# Patient Record
Sex: Female | Born: 1940 | Race: White | Hispanic: No | Marital: Married | Smoking: Never smoker
Health system: Southern US, Community
[De-identification: ages and names within clinical notes are randomized; demographics above are authoritative.]

## PROBLEM LIST (undated history)

## (undated) DIAGNOSIS — K219 Gastro-esophageal reflux disease without esophagitis: Secondary | ICD-10-CM

## (undated) DIAGNOSIS — IMO0001 Reserved for inherently not codable concepts without codable children: Secondary | ICD-10-CM

## (undated) DIAGNOSIS — M1990 Unspecified osteoarthritis, unspecified site: Secondary | ICD-10-CM

## (undated) DIAGNOSIS — I219 Acute myocardial infarction, unspecified: Secondary | ICD-10-CM

## (undated) DIAGNOSIS — Z9889 Other specified postprocedural states: Secondary | ICD-10-CM

## (undated) DIAGNOSIS — I10 Essential (primary) hypertension: Secondary | ICD-10-CM

## (undated) DIAGNOSIS — Z5189 Encounter for other specified aftercare: Secondary | ICD-10-CM

## (undated) DIAGNOSIS — G971 Other reaction to spinal and lumbar puncture: Secondary | ICD-10-CM

## (undated) DIAGNOSIS — J189 Pneumonia, unspecified organism: Secondary | ICD-10-CM

## (undated) DIAGNOSIS — R112 Nausea with vomiting, unspecified: Secondary | ICD-10-CM

## (undated) DIAGNOSIS — F419 Anxiety disorder, unspecified: Secondary | ICD-10-CM

## (undated) DIAGNOSIS — I209 Angina pectoris, unspecified: Secondary | ICD-10-CM

## (undated) DIAGNOSIS — E039 Hypothyroidism, unspecified: Secondary | ICD-10-CM

## (undated) DIAGNOSIS — R0602 Shortness of breath: Secondary | ICD-10-CM

## (undated) HISTORY — PX: JOINT REPLACEMENT: SHX530

## (undated) HISTORY — DX: Hypothyroidism, unspecified: E03.9

## (undated) HISTORY — PX: CARDIAC CATHETERIZATION: SHX172

---

## 1978-08-25 HISTORY — PX: HAND SURGERY: SHX662

## 1995-09-26 HISTORY — PX: TOTAL HIP ARTHROPLASTY: SHX124

## 1996-03-25 HISTORY — PX: TOTAL HIP ARTHROPLASTY: SHX124

## 1996-08-25 HISTORY — PX: TOTAL KNEE ARTHROPLASTY: SHX125

## 2004-12-17 ENCOUNTER — Ambulatory Visit: Payer: Self-pay | Admitting: Pulmonary Disease

## 2004-12-17 ENCOUNTER — Inpatient Hospital Stay (HOSPITAL_COMMUNITY): Admission: AD | Admit: 2004-12-17 | Discharge: 2004-12-29 | Payer: Self-pay | Admitting: Pulmonary Disease

## 2004-12-17 ENCOUNTER — Encounter: Payer: Self-pay | Admitting: *Deleted

## 2004-12-25 ENCOUNTER — Ambulatory Visit: Payer: Self-pay | Admitting: Cardiology

## 2005-01-03 ENCOUNTER — Ambulatory Visit: Payer: Self-pay | Admitting: Cardiology

## 2005-08-07 ENCOUNTER — Ambulatory Visit: Payer: Self-pay | Admitting: Cardiology

## 2005-09-04 ENCOUNTER — Ambulatory Visit: Payer: Self-pay

## 2005-09-17 ENCOUNTER — Ambulatory Visit: Payer: Self-pay | Admitting: Cardiology

## 2011-09-16 ENCOUNTER — Inpatient Hospital Stay (HOSPITAL_COMMUNITY)
Admission: AD | Admit: 2011-09-16 | Discharge: 2011-09-19 | DRG: 467 | Disposition: A | Discharge status: Skilled Nursing Facility | Payer: Medicare Other | Source: Other Acute Inpatient Hospital | Attending: Orthopedic Surgery | Admitting: Orthopedic Surgery

## 2011-09-16 ENCOUNTER — Other Ambulatory Visit: Payer: Self-pay

## 2011-09-16 ENCOUNTER — Encounter (HOSPITAL_COMMUNITY): Payer: Self-pay | Admitting: *Deleted

## 2011-09-16 DIAGNOSIS — K219 Gastro-esophageal reflux disease without esophagitis: Secondary | ICD-10-CM | POA: Diagnosis present

## 2011-09-16 DIAGNOSIS — T84069A Wear of articular bearing surface of unspecified internal prosthetic joint, initial encounter: Secondary | ICD-10-CM | POA: Diagnosis present

## 2011-09-16 DIAGNOSIS — F411 Generalized anxiety disorder: Secondary | ICD-10-CM | POA: Diagnosis present

## 2011-09-16 DIAGNOSIS — W010XXA Fall on same level from slipping, tripping and stumbling without subsequent striking against object, initial encounter: Secondary | ICD-10-CM | POA: Diagnosis present

## 2011-09-16 DIAGNOSIS — Z96659 Presence of unspecified artificial knee joint: Secondary | ICD-10-CM

## 2011-09-16 DIAGNOSIS — Y9229 Other specified public building as the place of occurrence of the external cause: Secondary | ICD-10-CM

## 2011-09-16 DIAGNOSIS — I252 Old myocardial infarction: Secondary | ICD-10-CM

## 2011-09-16 DIAGNOSIS — M9702XA Periprosthetic fracture around internal prosthetic left hip joint, initial encounter: Secondary | ICD-10-CM

## 2011-09-16 DIAGNOSIS — M129 Arthropathy, unspecified: Secondary | ICD-10-CM | POA: Diagnosis present

## 2011-09-16 DIAGNOSIS — I10 Essential (primary) hypertension: Secondary | ICD-10-CM | POA: Diagnosis present

## 2011-09-16 DIAGNOSIS — S72033A Displaced midcervical fracture of unspecified femur, initial encounter for closed fracture: Principal | ICD-10-CM | POA: Diagnosis present

## 2011-09-16 DIAGNOSIS — Z96649 Presence of unspecified artificial hip joint: Secondary | ICD-10-CM

## 2011-09-16 DIAGNOSIS — Y831 Surgical operation with implant of artificial internal device as the cause of abnormal reaction of the patient, or of later complication, without mention of misadventure at the time of the procedure: Secondary | ICD-10-CM | POA: Diagnosis present

## 2011-09-16 HISTORY — DX: Unspecified osteoarthritis, unspecified site: M19.90

## 2011-09-16 HISTORY — DX: Other specified postprocedural states: Z98.890

## 2011-09-16 HISTORY — DX: Other reaction to spinal and lumbar puncture: G97.1

## 2011-09-16 HISTORY — DX: Reserved for inherently not codable concepts without codable children: IMO0001

## 2011-09-16 HISTORY — DX: Encounter for other specified aftercare: Z51.89

## 2011-09-16 HISTORY — DX: Other specified postprocedural states: R11.2

## 2011-09-16 HISTORY — DX: Essential (primary) hypertension: I10

## 2011-09-16 HISTORY — DX: Angina pectoris, unspecified: I20.9

## 2011-09-16 HISTORY — DX: Shortness of breath: R06.02

## 2011-09-16 HISTORY — DX: Gastro-esophageal reflux disease without esophagitis: K21.9

## 2011-09-16 HISTORY — DX: Acute myocardial infarction, unspecified: I21.9

## 2011-09-16 HISTORY — DX: Anxiety disorder, unspecified: F41.9

## 2011-09-16 MED ORDER — PREDNISONE (PAK) 5 MG PO TABS
5.0000 mg | ORAL_TABLET | Freq: Once | ORAL | Status: AC
  Administered 2011-09-16: 5 mg via ORAL
  Filled 2011-09-16: qty 21

## 2011-09-16 MED ORDER — SODIUM CHLORIDE 0.9 % IV SOLN
INTRAVENOUS | Status: DC
  Administered 2011-09-16 – 2011-09-17 (×3): via INTRAVENOUS
  Filled 2011-09-16 (×8): qty 1000

## 2011-09-16 MED ORDER — CHLORHEXIDINE GLUCONATE 0.12 % MT SOLN
15.0000 mL | Freq: Two times a day (BID) | OROMUCOSAL | Status: DC
  Administered 2011-09-16 – 2011-09-19 (×6): 15 mL via OROMUCOSAL
  Filled 2011-09-16 (×10): qty 15

## 2011-09-16 MED ORDER — LISINOPRIL 2.5 MG PO TABS
2.5000 mg | ORAL_TABLET | Freq: Every day | ORAL | Status: DC
  Administered 2011-09-16 – 2011-09-19 (×4): 2.5 mg via ORAL
  Filled 2011-09-16 (×5): qty 1

## 2011-09-16 MED ORDER — BIOTENE DRY MOUTH MT LIQD
15.0000 mL | Freq: Two times a day (BID) | OROMUCOSAL | Status: DC
  Administered 2011-09-16 – 2011-09-19 (×7): 15 mL via OROMUCOSAL

## 2011-09-16 MED ORDER — PANTOPRAZOLE SODIUM 40 MG PO TBEC
40.0000 mg | DELAYED_RELEASE_TABLET | Freq: Every day | ORAL | Status: DC | PRN
  Administered 2011-09-19: 40 mg via ORAL
  Filled 2011-09-16: qty 1

## 2011-09-16 MED ORDER — HYDROMORPHONE HCL PF 1 MG/ML IJ SOLN
0.5000 mg | INTRAMUSCULAR | Status: DC | PRN
  Administered 2011-09-16 (×5): 1 mg via INTRAVENOUS
  Administered 2011-09-17: 0.5 mg via INTRAVENOUS
  Administered 2011-09-17 (×4): 1 mg via INTRAVENOUS
  Filled 2011-09-16 (×10): qty 1

## 2011-09-16 MED ORDER — ACETAMINOPHEN 500 MG PO TABS
1000.0000 mg | ORAL_TABLET | Freq: Three times a day (TID) | ORAL | Status: DC
  Administered 2011-09-16 – 2011-09-17 (×4): 1000 mg via ORAL
  Filled 2011-09-16 (×13): qty 2

## 2011-09-16 NOTE — Anesthesia Preprocedure Evaluation (Addendum)
Anesthesia Evaluation  Patient identified by MRN, date of birth, ID band Patient awake    Reviewed: Allergy & Precautions, H&P , NPO status , Patient's Chart, lab work & pertinent test results  History of Anesthesia Complications (+) PONV and POST - OP SPINAL HEADACHE  Airway Mallampati: IV TM Distance: <3 FB Neck ROM: full  Mouth opening: Limited Mouth Opening Comment: Potential difficult intubation Dental  (+) Loose and Missing,    Pulmonary neg pulmonary ROS, shortness of breath and with exertion,  clear to auscultation  Pulmonary exam normal       Cardiovascular Exercise Tolerance: Good hypertension, On Medications + angina with exertion + CAD and + Past MI regular Normal MI in 2006.  Had cath. Then and has had no problems since.  Cardiology released her from follow up.  No CP.   Neuro/Psych  Headaches, Negative Neurological ROS  Negative Psych ROS   GI/Hepatic negative GI ROS, Neg liver ROS, GERD-  ,  Endo/Other  Negative Endocrine ROS  Renal/GU negative Renal ROS  Genitourinary negative   Musculoskeletal  (+) Rheumatoid disorders,    Abdominal   Peds  Hematology negative hematology ROS (+)   Anesthesia Other Findings   Reproductive/Obstetrics negative OB ROS                         Anesthesia Physical Anesthesia Plan  ASA: III  Anesthesia Plan: General   Post-op Pain Management:    Induction: Intravenous  Airway Management Planned: Oral ETT and Video Laryngoscope Planned  Additional Equipment:   Intra-op Plan:   Post-operative Plan: Extubation in OR  Informed Consent: I have reviewed the patients History and Physical, chart, labs and discussed the procedure including the risks, benefits and alternatives for the proposed anesthesia with the patient or authorized representative who has indicated his/her understanding and acceptance.   Dental Advisory Given  Plan Discussed  with: CRNA and Surgeon  Anesthesia Plan Comments: (She had a PDPH for a month previously but is willing to have SAB if anesthesiologist prefers this.  She is also OK with GA but you will need glide scope.  I got Dr. Charlann Boxer to order labs and ECG.)      Anesthesia Quick Evaluation

## 2011-09-16 NOTE — H&P (Signed)
Alyssa Coleman is an 71 y.o. female.    Chief Complaint: left hip pain after a fall  HPI: Pt is a 71 y.o. female complaining of left hip pain since last night. She was at [employer] last night, as she was leaving she possibly tripped over a mat and fell.  She fell directly on her left hip and had instant pain. She was brought to the ER in [place], [place] where x-rays revealed a left hip peri-prosthetic fracture. Pt then asked to be transferred to [facility] since her doctors are from the area. After having a discussion with the pt she understands that she will need surgery to fix the fracture. Risks, benefits and expectations were discussed with the patient. Patient understand the risks, benefits and expectations and wishes to proceed with an ORIF of the left peri-prosthetic fracture.  PCP:  No primary provider on file.  D/C Plans:  SNF/Rehab  Post-op Meds:  No Rx given   PMH: Past Medical History  Diagnosis Date  . PONV (postoperative nausea and vomiting)   . Spinal headache   . Shortness of breath   . Angina   . Hypertension   . Blood transfusion   . GERD (gastroesophageal reflux disease)   . Arthritis   . Anxiety   . Myocardial infarction     PSH: Past Surgical History  Procedure Date  . 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 Total hip arthroplasty aug 97    right hip  . Total knee arthroplasty 1998    left  . Hand surgery 1980    skin infection  . Cardiac catheterization   . Joint replacement     Social History:  reports that she has never smoked. She does not have any smokeless tobacco history on file. She reports that she does not drink alcohol or use illicit drugs.  Allergies:  Allergies  Allergen Reactions  . Adult Aspirin Ec (Aspirin) Nausea Only  . Codeine Nausea Only  . Hydrocodone Hives    Medications: Current Facility-Administered Medications  Medication Dose Route Frequency Provider Last Rate Last Dose  . antiseptic  oral rinse (BIOTENE) solution 15 mL  15 mL Mouth Rinse q12n4p Alyssa Pal, MD      . chlorhexidine (PERIDEX) 0.12 % solution 15 mL  15 mL Mouth Rinse BID Alyssa Pal, MD   15 mL at 09/16/11 0811  . HYDROmorphone (DILAUDID) injection 0.5-1 mg  0.5-1 mg Intravenous Q2H PRN Alyssa [facility] [place], PA   1 mg at 09/16/11 1610  . sodium chloride 0.9 % 1,000 mL with potassium chloride 10 mEq infusion   Intravenous Continuous Alyssa Coleman Alyssa [place], PA 75 mL/hr at 09/16/11 0510      ROS: Review of Systems  Constitutional: Negative.   HENT: Negative.   Eyes: Negative.   Respiratory: Negative.   Cardiovascular: Negative.   Gastrointestinal: Negative.   Genitourinary: Negative.   Musculoskeletal: Positive for joint pain (primarily acute pain of the left hip. Generalized joint pain from RA) and falls (tripped and fell for most acute problem).  Skin: Negative.   Neurological: Negative.   Endo/Heme/Allergies: Negative.   Psychiatric/Behavioral: Negative.      Physican Exam: Blood pressure 145/70, pulse 80, temperature 99.2 F (37.3 C), temperature source Oral, resp. rate 20, height 5\' 7"  (1.702 m), weight 140 lb 1.6 oz (63.549 kg). Physical Exam  Constitutional: She is well-developed, well-nourished, and in no distress.  HENT:  Head: Normocephalic and atraumatic.  Nose: Nose normal.  Mouth/Throat: Oropharynx is clear and moist.  Eyes: Pupils are equal, round, and reactive to light.  Neck: Neck supple. No JVD present. No tracheal deviation present. No thyromegaly present.  Cardiovascular: Normal rate and regular rhythm.   Pulmonary/Chest: Effort normal and breath sounds normal.  Abdominal: Soft. There is no tenderness. There is no guarding.  Musculoskeletal:       Left hip: She exhibits decreased range of motion (pain with any ROM), decreased strength (generalized, unable to fully test do to pain with any ROM), tenderness and bony tenderness. She exhibits no swelling, no crepitus, no  deformity and no laceration.  Skin: Skin is warm and dry.  Psychiatric: Affect normal.     Assessment/Plan Assessment: Left hip peri-prosthetic fracture  Plan: Patient will undergo an ORIF of the left hip peri-prosthetic fracture. Risks benefits and expectation were discussed with the patient. Patient understand risks, benefits and expectation and wishes to proceed.   Alyssa [facility]   PAC  09/16/2011, 11:34 AM

## 2011-09-17 ENCOUNTER — Encounter (HOSPITAL_COMMUNITY): Payer: Self-pay | Admitting: Anesthesiology

## 2011-09-17 ENCOUNTER — Encounter (HOSPITAL_COMMUNITY)
Admission: AD | Disposition: A | Discharge status: Skilled Nursing Facility | Payer: Self-pay | Source: Other Acute Inpatient Hospital | Attending: Orthopedic Surgery

## 2011-09-17 ENCOUNTER — Inpatient Hospital Stay (HOSPITAL_COMMUNITY): Payer: Medicare Other

## 2011-09-17 ENCOUNTER — Inpatient Hospital Stay (HOSPITAL_COMMUNITY): Payer: Medicare Other | Admitting: Anesthesiology

## 2011-09-17 DIAGNOSIS — M9702XA Periprosthetic fracture around internal prosthetic left hip joint, initial encounter: Secondary | ICD-10-CM

## 2011-09-17 HISTORY — PX: ORIF PERIPROSTHETIC FRACTURE: SHX5034

## 2011-09-17 LAB — BASIC METABOLIC PANEL
BUN: 9 mg/dL (ref 6–23)
CO2: 24 mEq/L (ref 19–32)
Chloride: 104 mEq/L (ref 96–112)
Creatinine, Ser: 0.49 mg/dL — ABNORMAL LOW (ref 0.50–1.10)
GFR calc Af Amer: >90 mL/min (ref >90)
Potassium: 3.7 mEq/L (ref 3.5–5.1)

## 2011-09-17 LAB — CBC
MCH: 32.8 pg (ref 26.0–34.0)
Platelets: 225 10*3/uL (ref 150–400)

## 2011-09-17 LAB — TYPE AND SCREEN
ABO/RH(D): O NEG
Antibody Screen: NEGATIVE

## 2011-09-17 LAB — SURGICAL PCR SCREEN: MRSA, PCR: NEGATIVE

## 2011-09-17 SURGERY — OPEN REDUCTION INTERNAL FIXATION (ORIF) PERIPROSTHETIC FRACTURE
Anesthesia: General | Site: Hip | Laterality: Left | Wound class: Clean

## 2011-09-17 MED ORDER — GLYCOPYRROLATE 0.2 MG/ML IJ SOLN
INTRAMUSCULAR | Status: DC | PRN
  Administered 2011-09-17: .2 mg via INTRAVENOUS
  Administered 2011-09-17: 0.2 mg via INTRAVENOUS

## 2011-09-17 MED ORDER — CEFAZOLIN SODIUM 1-5 GM-% IV SOLN
INTRAVENOUS | Status: DC | PRN
  Administered 2011-09-17: 1 g via INTRAVENOUS

## 2011-09-17 MED ORDER — SUCCINYLCHOLINE CHLORIDE 20 MG/ML IJ SOLN
INTRAMUSCULAR | Status: DC | PRN
  Administered 2011-09-17: 80 mg via INTRAVENOUS

## 2011-09-17 MED ORDER — ROCURONIUM BROMIDE 100 MG/10ML IV SOLN
INTRAVENOUS | Status: DC | PRN
  Administered 2011-09-17: 20 mg via INTRAVENOUS

## 2011-09-17 MED ORDER — PROPOFOL 10 MG/ML IV EMUL
INTRAVENOUS | Status: DC | PRN
  Administered 2011-09-17: 100 mg via INTRAVENOUS

## 2011-09-17 MED ORDER — METHOCARBAMOL 100 MG/ML IJ SOLN
500.0000 mg | Freq: Four times a day (QID) | INTRAVENOUS | Status: DC | PRN
  Administered 2011-09-17: 500 mg via INTRAVENOUS
  Filled 2011-09-17 (×2): qty 5

## 2011-09-17 MED ORDER — PHENYLEPHRINE HCL 10 MG/ML IJ SOLN
10.0000 mg | INTRAVENOUS | Status: DC | PRN
  Administered 2011-09-17: 10 ug/min via INTRAVENOUS

## 2011-09-17 MED ORDER — ACETAMINOPHEN 10 MG/ML IV SOLN
INTRAVENOUS | Status: DC | PRN
  Administered 2011-09-17: 1000 mg via INTRAVENOUS

## 2011-09-17 MED ORDER — STERILE WATER FOR IRRIGATION IR SOLN
Status: DC | PRN
  Administered 2011-09-17: 1500 mL

## 2011-09-17 MED ORDER — DEXAMETHASONE SODIUM PHOSPHATE 10 MG/ML IJ SOLN
INTRAMUSCULAR | Status: DC | PRN
  Administered 2011-09-17: 10 mg via INTRAVENOUS

## 2011-09-17 MED ORDER — PROMETHAZINE HCL 25 MG/ML IJ SOLN: 6.2500 mg | INTRAMUSCULAR | Status: DC | PRN

## 2011-09-17 MED ORDER — LIDOCAINE HCL (CARDIAC) 20 MG/ML IV SOLN
INTRAVENOUS | Status: DC | PRN
  Administered 2011-09-17: 50 mg via INTRAVENOUS

## 2011-09-17 MED ORDER — NEOSTIGMINE METHYLSULFATE 1 MG/ML IJ SOLN
INTRAMUSCULAR | Status: DC | PRN
  Administered 2011-09-17: 1.5 mg via INTRAVENOUS

## 2011-09-17 MED ORDER — FENTANYL CITRATE 0.05 MG/ML IJ SOLN
INTRAMUSCULAR | Status: DC | PRN
  Administered 2011-09-17 (×2): 75 ug via INTRAVENOUS
  Administered 2011-09-17 (×2): 50 ug via INTRAVENOUS
  Administered 2011-09-17: 75 ug via INTRAVENOUS

## 2011-09-17 MED ORDER — HETASTARCH-ELECTROLYTES 6 % IV SOLN
INTRAVENOUS | Status: DC | PRN
  Administered 2011-09-17: 20:00:00 via INTRAVENOUS

## 2011-09-17 MED ORDER — FENTANYL CITRATE 0.05 MG/ML IJ SOLN
25.0000 ug | INTRAMUSCULAR | Status: DC | PRN
  Administered 2011-09-17 (×2): 25 ug via INTRAVENOUS

## 2011-09-17 MED ORDER — METHOCARBAMOL 500 MG PO TABS
500.0000 mg | ORAL_TABLET | Freq: Four times a day (QID) | ORAL | Status: DC | PRN
  Administered 2011-09-19: 500 mg via ORAL
  Filled 2011-09-17: qty 1

## 2011-09-17 MED ORDER — 0.9 % SODIUM CHLORIDE (POUR BTL) OPTIME
TOPICAL | Status: DC | PRN
  Administered 2011-09-17 (×2): 1000 mL

## 2011-09-17 MED ORDER — LACTATED RINGERS IV SOLN
INTRAVENOUS | Status: DC | PRN
  Administered 2011-09-17 (×2): via INTRAVENOUS

## 2011-09-17 MED ORDER — PHENYLEPHRINE HCL 10 MG/ML IJ SOLN
INTRAMUSCULAR | Status: DC | PRN
  Administered 2011-09-17: 60 ug via INTRAVENOUS
  Administered 2011-09-17 (×2): 40 ug via INTRAVENOUS

## 2011-09-17 MED ORDER — ONDANSETRON HCL 4 MG/2ML IJ SOLN
INTRAMUSCULAR | Status: DC | PRN
  Administered 2011-09-17: 4 mg via INTRAVENOUS

## 2011-09-17 SURGICAL SUPPLY — 71 items
ADH SKN CLS APL DERMABOND .7 (GAUZE/BANDAGES/DRESSINGS) ×1
BAG SPEC THK2 15X12 ZIP CLS (MISCELLANEOUS) ×2
BAG ZIPLOCK 12X15 (MISCELLANEOUS) ×3 IMPLANT
BLADE SAW SGTL 18X1.27X75 (BLADE) ×1 IMPLANT
CABLE (Orthopedic Implant) ×3 IMPLANT
CLOTH BEACON ORANGE TIMEOUT ST (SAFETY) ×2 IMPLANT
CUBES CANC 30CC PCANCUBE30 (Bone Implant) ×2 IMPLANT
DERMABOND ADVANCED (GAUZE/BANDAGES/DRESSINGS) ×1
DERMABOND ADVANCED .7 DNX12 (GAUZE/BANDAGES/DRESSINGS) IMPLANT
DRAPE INCISE IOBAN 66X45 STRL (DRAPES) ×2 IMPLANT
DRAPE INCISE IOBAN 85X60 (DRAPES) ×1 IMPLANT
DRAPE ORTHO SPLIT 77X108 STRL (DRAPES) ×4
DRAPE POUCH INSTRU U-SHP 10X18 (DRAPES) ×2 IMPLANT
DRAPE SURG 17X11 SM STRL (DRAPES) ×2 IMPLANT
DRAPE SURG ORHT 6 SPLT 77X108 (DRAPES) ×2 IMPLANT
DRAPE U-SHAPE 47X51 STRL (DRAPES) ×2 IMPLANT
DRSG EMULSION OIL 3X16 NADH (GAUZE/BANDAGES/DRESSINGS) ×1 IMPLANT
DRSG MEPILEX BORDER 4X12 (GAUZE/BANDAGES/DRESSINGS) ×2 IMPLANT
DRSG MEPILEX BORDER 4X8 (GAUZE/BANDAGES/DRESSINGS) ×2 IMPLANT
DRSG PAD ABDOMINAL 8X10 ST (GAUZE/BANDAGES/DRESSINGS) ×2 IMPLANT
DRSG TEGADERM 4X4.75 (GAUZE/BANDAGES/DRESSINGS) ×1 IMPLANT
DURAPREP 26ML APPLICATOR (WOUND CARE) ×2 IMPLANT
ELECT BLADE TIP CTD 4 INCH (ELECTRODE) ×2 IMPLANT
ELECT REM PT RETURN 9FT ADLT (ELECTROSURGICAL) ×2
ELECTRODE REM PT RTRN 9FT ADLT (ELECTROSURGICAL) ×1 IMPLANT
EVACUATOR 1/8 PVC DRAIN (DRAIN) ×2 IMPLANT
FACESHIELD LNG OPTICON STERILE (SAFETY) ×8 IMPLANT
GAUZE SPONGE 2X2 8PLY STRL LF (GAUZE/BANDAGES/DRESSINGS) IMPLANT
GLOVE BIOGEL PI IND STRL 7.5 (GLOVE) ×1 IMPLANT
GLOVE BIOGEL PI IND STRL 8 (GLOVE) ×1 IMPLANT
GLOVE BIOGEL PI INDICATOR 7.5 (GLOVE) ×1
GLOVE BIOGEL PI INDICATOR 8 (GLOVE) ×2
GLOVE ECLIPSE 8.0 STRL XLNG CF (GLOVE) IMPLANT
GLOVE ORTHO TXT STRL SZ7.5 (GLOVE) ×3 IMPLANT
GLOVE SURG ORTHO 8.0 STRL STRW (GLOVE) ×3 IMPLANT
GLOVE SURG SS PI 6.5 STRL IVOR (GLOVE) ×2 IMPLANT
GLOVE SURG SS PI 8.0 STRL IVOR (GLOVE) ×1 IMPLANT
GLOVE SURG SS PI 8.5 STRL IVOR (GLOVE) ×2
GLOVE SURG SS PI 8.5 STRL STRW (GLOVE) IMPLANT
GOWN BRE IMP PREV XXLGXLNG (GOWN DISPOSABLE) ×3 IMPLANT
GOWN STRL NON-REIN LRG LVL3 (GOWN DISPOSABLE) ×2 IMPLANT
GRAFT BNE CANC CUBE 1X1X1 30CC (Bone Implant) IMPLANT
HEAD OSTEO CTAPER L FIT (Orthopedic Implant) ×2 IMPLANT
HEAD OSTEO CTAPER L FIT 32 +5 (Orthopedic Implant) IMPLANT
IMMOBILIZER KNEE 20 (SOFTGOODS) ×2
IMMOBILIZER KNEE 20 THIGH 36 (SOFTGOODS) IMPLANT
INSERT ACTB 10D 54X32XSTRL LF (Orthopedic Implant) IMPLANT
INSERT OSTEO CFIRE ACET (Orthopedic Implant) ×2 IMPLANT
INSRT ACTB 10D 54X32XSTRL LF (Orthopedic Implant) ×1 IMPLANT
KIT BASIN OR (CUSTOM PROCEDURE TRAY) ×2 IMPLANT
MANIFOLD NEPTUNE II (INSTRUMENTS) ×2 IMPLANT
NS IRRIG 1000ML POUR BTL (IV SOLUTION) ×4 IMPLANT
PACK TOTAL JOINT (CUSTOM PROCEDURE TRAY) ×2 IMPLANT
PASSER SUT SWANSON 36MM LOOP (INSTRUMENTS) IMPLANT
POSITIONER SURGICAL ARM (MISCELLANEOUS) ×2 IMPLANT
SPONGE GAUZE 2X2 STER 10/PKG (GAUZE/BANDAGES/DRESSINGS) ×1
SPONGE GAUZE 4X4 12PLY (GAUZE/BANDAGES/DRESSINGS) ×2 IMPLANT
SPONGE LAP 18X18 X RAY DECT (DISPOSABLE) ×2 IMPLANT
SPONGE LAP 4X18 X RAY DECT (DISPOSABLE) ×1 IMPLANT
STAPLER VISISTAT 35W (STAPLE) ×3 IMPLANT
STRIP CLOSURE SKIN 1/2X4 (GAUZE/BANDAGES/DRESSINGS) IMPLANT
SUCTION FRAZIER TIP 10 FR DISP (SUCTIONS) ×2 IMPLANT
SUT ETHIBOND NAB CT1 #1 30IN (SUTURE) IMPLANT
SUT MNCRL AB 4-0 PS2 18 (SUTURE) IMPLANT
SUT VIC AB 1 CT1 27 (SUTURE) ×8
SUT VIC AB 1 CT1 27XBRD ANTBC (SUTURE) ×2 IMPLANT
SUT VIC AB 2-0 CT1 27 (SUTURE) ×8
SUT VIC AB 2-0 CT1 TAPERPNT 27 (SUTURE) ×2 IMPLANT
TOWEL OR 17X26 10 PK STRL BLUE (TOWEL DISPOSABLE) ×4 IMPLANT
TRAY FOLEY CATH 14FRSI W/METER (CATHETERS) ×1 IMPLANT
WATER STERILE IRR 1500ML POUR (IV SOLUTION) ×2 IMPLANT

## 2011-09-17 NOTE — Transfer of Care (Signed)
Immediate Anesthesia Transfer of Care Note  Patient: Alyssa [facility]  Procedure(s) Performed:  OPEN REDUCTION INTERNAL FIXATION (ORIF) PERIPROSTHETIC FRACTURE  Patient Location: PACU  Anesthesia Type: General  Level of Consciousness: awake, alert , patient cooperative and responds to stimulation  Airway & Oxygen Therapy: Patient Spontanous Breathing and Patient connected to face mask oxygen  Post-op Assessment: Report given to PACU RN, Post -op Vital signs reviewed and stable and Patient moving all extremities  Post vital signs: Reviewed and stable  Complications: No apparent anesthesia complications

## 2011-09-17 NOTE — Brief Op Note (Signed)
09/16/2011 - 09/17/2011  8:21 PM  PATIENT:  Alyssa [facility]  71 y.o. female  PRE-OPERATIVE DIAGNOSIS:  Periprosthetic Fracture of the Left Hip status post LEFT THR 1997  POST-OPERATIVE DIAGNOSIS: Periprosthetic Fracture of the Left Hip status post LEFT THR 1997, stable femoral component  PROCEDURE:  Procedure(s): OPEN REDUCTION INTERNAL FIXATION (ORIF) LEFT PERIPROSTHETIC femur fracture around and stable femoral stem, 2. Revision of the left hip replacement With new updated acetabular liner and new femoral head, [employer] 54/56-32-10 degree lipped liner, 32+5 head   SURGEON:  Surgeon(s): Shelda Pal, MD  PHYSICIAN ASSISTANT: Lanney Gins, PA-C  ANESTHESIA:   general  EBL:  Total I/O In: 1500 [I.V.:1000; IV Piggyback:500] Out: 375 [Urine:75; Blood:300]  BLOOD ADMINISTERED:none  DRAINS: (1 medium) Hemovact drain(s) in the left hip with  Suction Open   LOCAL MEDICATIONS USED:  NONE  SPECIMEN:  No Specimen  DISPOSITION OF SPECIMEN:  N/A  COUNTS:  YES  TOURNIQUET:  * No tourniquets in log *  DICTATION: .Other Dictation: Dictation Number [id number]  PLAN OF CARE: Admit to inpatient   PATIENT DISPOSITION:  PACU - hemodynamically stable.   Delay start of Pharmacological VTE agent (>24hrs) due to surgical blood loss or risk of bleeding:  {YES/NO/NOT APPLICABLE:20182

## 2011-09-17 NOTE — Progress Notes (Signed)
Subjective:   Procedure(s) (LRB): OPEN REDUCTION INTERNAL FIXATION (ORIF) PERIPROSTHETIC FRACTURE (Right)   Patient reports pain as moderate. No events. Ready to have her hip fixed. No change in condition.  Objective:   VITALS:   Filed Vitals:   09/17/11 1037  BP: 134/72  Pulse: 74  Temp: 98.7 F (37.1 C)  Resp: 17    Neurovascular intact Dorsiflexion/Plantar flexion intact No cellulitis present Compartment soft  LABS  Basename 09/17/11 0415  HGB 10.5*  HCT 31.6*  WBC 13.5*  PLT 225     Basename 09/17/11 0415  NA 135  K 3.7  BUN 9  CREATININE 0.49*  GLUCOSE 109*     Assessment/Plan:   Procedure(s) (LRB): OPEN REDUCTION INTERNAL FIXATION (ORIF) PERIPROSTHETIC FRACTURE (Right)   NPO until after surgery later today Continue to control pain with IV medication No change in condition from yesterday Ready for surgical fixation of her left hip periprosthetic fracture.   Anastasio Auerbach Sharry Beining   PAC  09/17/2011, 1:22 PM

## 2011-09-17 NOTE — Anesthesia Postprocedure Evaluation (Signed)
  Anesthesia Post-op Note  Patient: Alyssa [facility]  Procedure(s) Performed:  OPEN REDUCTION INTERNAL FIXATION (ORIF) PERIPROSTHETIC FRACTURE  Patient Location: PACU  Anesthesia Type: General  Level of Consciousness: awake and alert   Airway and Oxygen Therapy: Patient Spontanous Breathing  Post-op Pain: mild  Post-op Assessment: Post-op Vital signs reviewed, Patient's Cardiovascular Status Stable, Respiratory Function Stable, Patent Airway and No signs of Nausea or vomiting  Post-op Vital Signs: stable  Complications: No apparent anesthesia complications

## 2011-09-17 NOTE — Preoperative (Signed)
Beta Blockers   Reason not to administer Beta Blockers:Not Applicable 

## 2011-09-17 NOTE — Progress Notes (Signed)
CSW met with pt /family to assist with d/c planning. Pt hopes to return home following surgery. The possibility of ST SNF discussed with pt. CSW will assist  with SNF placement if recommended by PT and pt is agreeable with plan. Will continue to follow pt to assist with d/c planning needs.

## 2011-09-17 NOTE — Interval H&P Note (Signed)
History and Physical Interval Note:  09/17/2011 6:22 PM  [name]  has presented today for surgery, with the diagnosis of Periprosthetic Fracture of the Left Hip  The various methods of treatment have been discussed with the patient and family. After consideration of risks, benefits and other options for treatment, the patient has consented to  Procedure(s): OPEN REDUCTION INTERNAL FIXATION (ORIF) LEFT HIP PERIPROSTHETIC FRACTURE WITH REVISION OF ARTICULAR SURFACE VERSUS REVISION LEFT HIP SURGERY as a surgical intervention .  The patients' history has been reviewed, patient examined, no change in status, stable for surgery.  I have reviewed the patients' chart and labs.  Questions were answered to the patient's satisfaction.     Alyssa Coleman

## 2011-09-17 NOTE — Interval H&P Note (Deleted)
History and Physical Interval Note:  09/17/2011 6:10 PM  Alyssa Coleman  has presented today for surgery, with the diagnosis of Periprosthetic Fracture of the Right Hip  The various methods of treatment have been discussed with the patient and family. After consideration of risks, benefits and other options for treatment, the patient has consented to  Procedure(s): OPEN REDUCTION INTERNAL FIXATION (ORIF) RIGHT HIP PERIPROSTHETIC FRACTURE WITH REVISION OF THE ARTICULAR  SURFACE WITH BONE GRAFTING as a surgical intervention .  The patients' history has been reviewed, patient examined, no change in status, stable for surgery.  I have reviewed the patients' chart and labs.  Questions were answered to the patient's satisfaction.     Shelda Pal

## 2011-09-17 NOTE — H&P (View-Only) (Signed)
Subjective:   Procedure(s) (LRB): OPEN REDUCTION INTERNAL FIXATION (ORIF) PERIPROSTHETIC FRACTURE (Right)   Patient reports pain as moderate. No events. Ready to have her hip fixed. No change in condition.  Objective:   VITALS:   Filed Vitals:   09/17/11 1037  BP: 134/72  Pulse: 74  Temp: 98.7 F (37.1 C)  Resp: 17    Neurovascular intact Dorsiflexion/Plantar flexion intact No cellulitis present Compartment soft  LABS  Basename 09/17/11 0415  HGB 10.5*  HCT 31.6*  WBC 13.5*  PLT 225     Basename 09/17/11 0415  NA 135  K 3.7  BUN 9  CREATININE 0.49*  GLUCOSE 109*     Assessment/Plan:   Procedure(s) (LRB): OPEN REDUCTION INTERNAL FIXATION (ORIF) PERIPROSTHETIC FRACTURE (Right)   NPO until after surgery later today Continue to control pain with IV medication No change in condition from yesterday Ready for surgical fixation of her left hip periprosthetic fracture.   Kha Hari S. Jermine Bibbee   PAC  09/17/2011, 1:22 PM   

## 2011-09-18 LAB — BASIC METABOLIC PANEL
BUN: 9 mg/dL (ref 6–23)
Chloride: 105 mEq/L (ref 96–112)
GFR calc Af Amer: >90 mL/min (ref >90)
Glucose, Bld: 143 mg/dL — ABNORMAL HIGH (ref 70–99)
Potassium: 3.9 mEq/L (ref 3.5–5.1)
Sodium: 135 mEq/L (ref 135–145)

## 2011-09-18 LAB — CBC
HCT: 24.3 % — ABNORMAL LOW (ref 36.0–46.0)
Hemoglobin: 8.4 g/dL — ABNORMAL LOW (ref 12.0–15.0)
WBC: 11.9 10*3/uL — ABNORMAL HIGH (ref 4.0–10.5)

## 2011-09-18 MED ORDER — ONDANSETRON HCL 4 MG/2ML IJ SOLN: 4.0000 mg | Freq: Four times a day (QID) | INTRAMUSCULAR | Status: DC | PRN

## 2011-09-18 MED ORDER — ALUM & MAG HYDROXIDE-SIMETH 200-200-20 MG/5ML PO SUSP: 30.0000 mL | ORAL | Status: DC | PRN

## 2011-09-18 MED ORDER — METOCLOPRAMIDE HCL 10 MG PO TABS: 5.0000 mg | ORAL_TABLET | Freq: Three times a day (TID) | ORAL | Status: DC | PRN

## 2011-09-18 MED ORDER — FLEET ENEMA 7-19 GM/118ML RE ENEM: 1.0000 | ENEMA | Freq: Once | RECTAL | Status: AC | PRN

## 2011-09-18 MED ORDER — ZOLPIDEM TARTRATE 5 MG PO TABS: 5.0000 mg | ORAL_TABLET | Freq: Every evening | ORAL | Status: DC | PRN

## 2011-09-18 MED ORDER — MENTHOL 3 MG MT LOZG: 1.0000 | LOZENGE | OROMUCOSAL | Status: DC | PRN

## 2011-09-18 MED ORDER — DOCUSATE SODIUM 100 MG PO CAPS
100.0000 mg | ORAL_CAPSULE | Freq: Two times a day (BID) | ORAL | Status: DC
  Administered 2011-09-18 – 2011-09-19 (×4): 100 mg via ORAL
  Filled 2011-09-18 (×6): qty 1

## 2011-09-18 MED ORDER — METOCLOPRAMIDE HCL 5 MG/ML IJ SOLN: 5.0000 mg | Freq: Three times a day (TID) | INTRAMUSCULAR | Status: DC | PRN

## 2011-09-18 MED ORDER — BISACODYL 5 MG PO TBEC: 5.0000 mg | DELAYED_RELEASE_TABLET | Freq: Every day | ORAL | Status: DC | PRN

## 2011-09-18 MED ORDER — ACETAMINOPHEN 325 MG PO TABS: 650.0000 mg | ORAL_TABLET | Freq: Four times a day (QID) | ORAL | Status: DC | PRN

## 2011-09-18 MED ORDER — DIPHENHYDRAMINE HCL 25 MG PO CAPS: 25.0000 mg | ORAL_CAPSULE | Freq: Four times a day (QID) | ORAL | Status: DC | PRN

## 2011-09-18 MED ORDER — POLYETHYLENE GLYCOL 3350 17 G PO PACK
17.0000 g | PACK | Freq: Two times a day (BID) | ORAL | Status: DC
  Administered 2011-09-18 – 2011-09-19 (×4): 17 g via ORAL
  Filled 2011-09-18 (×6): qty 1

## 2011-09-18 MED ORDER — HYDROMORPHONE HCL PF 1 MG/ML IJ SOLN
0.5000 mg | INTRAMUSCULAR | Status: DC | PRN
  Administered 2011-09-18 (×3): 1 mg via INTRAVENOUS
  Filled 2011-09-18 (×3): qty 1

## 2011-09-18 MED ORDER — ONDANSETRON HCL 4 MG PO TABS: 4.0000 mg | ORAL_TABLET | Freq: Four times a day (QID) | ORAL | Status: DC | PRN

## 2011-09-18 MED ORDER — ENOXAPARIN SODIUM 40 MG/0.4ML ~~LOC~~ SOLN
40.0000 mg | SUBCUTANEOUS | Status: DC
  Administered 2011-09-18 – 2011-09-19 (×2): 40 mg via SUBCUTANEOUS
  Filled 2011-09-18 (×2): qty 0.4

## 2011-09-18 MED ORDER — ACETAMINOPHEN 650 MG RE SUPP: 650.0000 mg | Freq: Four times a day (QID) | RECTAL | Status: DC | PRN

## 2011-09-18 MED ORDER — TRAMADOL HCL 50 MG PO TABS
50.0000 mg | ORAL_TABLET | Freq: Four times a day (QID) | ORAL | Status: DC
  Administered 2011-09-18 (×2): 100 mg via ORAL
  Administered 2011-09-18: 50 mg via ORAL
  Administered 2011-09-18: 100 mg via ORAL
  Administered 2011-09-19 (×3): 50 mg via ORAL
  Filled 2011-09-18: qty 1
  Filled 2011-09-18: qty 2
  Filled 2011-09-18 (×2): qty 1
  Filled 2011-09-18 (×6): qty 2

## 2011-09-18 MED ORDER — CEFAZOLIN SODIUM 1-5 GM-% IV SOLN
1.0000 g | Freq: Four times a day (QID) | INTRAVENOUS | Status: AC
  Administered 2011-09-18 (×3): 1 g via INTRAVENOUS
  Filled 2011-09-18 (×4): qty 50

## 2011-09-18 MED ORDER — FERROUS SULFATE 325 (65 FE) MG PO TABS
325.0000 mg | ORAL_TABLET | Freq: Three times a day (TID) | ORAL | Status: DC
  Administered 2011-09-18 – 2011-09-19 (×4): 325 mg via ORAL
  Filled 2011-09-18 (×6): qty 1

## 2011-09-18 MED ORDER — PHENOL 1.4 % MT LIQD: 1.0000 | OROMUCOSAL | Status: DC | PRN

## 2011-09-18 MED ORDER — SODIUM CHLORIDE 0.9 % IV SOLN
100.0000 mL/h | INTRAVENOUS | Status: DC
  Administered 2011-09-18 (×2): 100 mL/h via INTRAVENOUS
  Filled 2011-09-18 (×9): qty 1000

## 2011-09-18 NOTE — Evaluation (Signed)
Physical Therapy Evaluation Patient Details Name: Alyssa Coleman MRN: [id number] DOB: Aug 31, 1940 Today's Date: 09/18/2011 1000-1032 Ev 2 Problem List:  Patient Active Problem List  Diagnoses  . Periprosthetic fracture around internal prosthetic left hip joint    Past Medical History:  Past Medical History  Diagnosis Date  . PONV (postoperative nausea and vomiting)   . Spinal headache   . Shortness of breath   . Angina   . Hypertension   . Blood transfusion   . GERD (gastroesophageal reflux disease)   . Arthritis   . Anxiety   . Myocardial infarction    Past Surgical History:  Past Surgical History  Procedure Date  . 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 Total hip arthroplasty aug 97    right hip  . Total knee arthroplasty 1998    left  . Hand surgery 1980    skin infection  . Cardiac catheterization   . Joint replacement     PT Assessment/Plan/Recommendation PT Assessment Clinical Impression Statement: pt will benefit from PT to maximize independence; Pt srtongly desires to D/C home although she would benefit from [facility];  PT and MD discussed wtih pt; PT Recommendation/Assessment: Patient will need skilled PT in the acute care venue PT Problem List: Decreased strength;Decreased range of motion;Decreased activity tolerance;Decreased balance;Decreased mobility;Pain;Decreased knowledge of precautions;Decreased safety awareness;Decreased knowledge of use of DME PT Plan PT Frequency: Min 6X/week PT Treatment/Interventions: DME instruction;Gait training;Stair training;Functional mobility training;Therapeutic activities;Therapeutic exercise;Balance training;Patient/family education PT Recommendation Follow Up Recommendations: Skilled nursing facility Equipment Recommended: Rolling walker with 5" wheels;Wheelchair (measurements) (if home, otherwise defer to next venue;) PT Goals  Acute Rehab PT Goals PT Goal Formulation: With patient Time For Goal Achievement: 2  weeks Pt will go Supine/Side to Sit: with max assist PT Goal: Supine/Side to Sit - Progress: Goal set today Pt will go Sit to Supine/Side: with max assist PT Goal: Sit to Supine/Side - Progress: Goal set today Pt will go Sit to Stand: with max assist PT Goal: Sit to Stand - Progress: Goal set today Pt will go Stand to Sit: with max assist PT Goal: Stand to Sit - Progress: Goal set today Pt will Transfer Bed to Chair/Chair to Bed: with max assist PT Transfer Goal: Bed to Chair/Chair to Bed - Progress: Goal set today Pt will Ambulate: 1 - 15 feet;with other equipment (comment);with mod assist (PFRW) PT Goal: Ambulate - Progress: Goal set today Pt will Perform Home Exercise Program: with min assist PT Goal: Perform Home Exercise Program - Progress: Goal set today  PT Evaluation Precautions/Restrictions  Precautions Precautions: Posterior Hip Restrictions LLE Weight Bearing: Partial weight bearing Prior Functioning  Home Living Lives With: Spouse Receives Help From: Family Type of Home: House Home Layout: One level Home Access: Ramped entrance Home Adaptive Equipment: Wheelchair - manual;Bedside commode/3-in-1;Walker - rolling (lift chair) Additional Comments: bilateral platforms old; and RW old; tub transfer bench is very old also Prior Function Level of Independence: Needs assistance with tranfers;Independent with gait (husband assists with amb most of time) Driving: No Cognition Cognition Arousal/Alertness: Awake/alert Overall Cognitive Status: Appears within functional limits for tasks assessed Orientation Level: Oriented X4 Sensation/Coordination   Extremity Assessment RLE Strength RLE Overall Strength Comments: 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; hip flexion and knee extension  3/5; ankle grossly WFL LLE Assessment LLE Assessment:  (AAROM WFL; NT further d/t pain) Mobility (including Balance) Bed Mobility Supine to Sit: 1: +2 Total assist;Patient percentage  (comment);HOB elevated (Comment  degrees) Supine to Sit Details (indicate cue type and reason): HOB up 40 degrees;  pt = 10%; +2 for UB/LB, scooting Sitting - Scoot to Edge of Bed: 1: +2 Total assist Sitting - Scoot to Edge of Bed Details (indicate cue type and reason): +2 for wt shift, scooting; safety Transfers Sit to Stand: 1: +2 Total assist;From bed;Patient percentage (comment);From elevated surface Sit to Stand Details (indicate cue type and reason): +2 for anterior-superior wt shift. bed ht elevated due to THP and pt unable to flex right knee (at baseline) Pt =5% Stand to Sit: 1: +2 Total assist Stand to Sit Details: pt =5%; +2 for contol of descent, HOH to bring UEs off of RW platforms Stand Pivot Transfers: 1: +2 Total assist;Patient percentage (comment) Stand Pivot Transfer Details (indicate cue type and reason): +2 for wt shift, balance;pt=10%    Exercise  Total Joint Exercises Ankle Circles/Pumps: AROM;Both;10 reps Heel Slides: AAROM;Left;10 reps End of Session PT - End of Session Equipment Utilized During Treatment: Gait belt Activity Tolerance: Patient limited by fatigue Patient left: in chair General Behavior During Session: [facility] for tasks performed Cognition: [facility] for tasks performed  [facility] 09/18/2011, 1:46 PM

## 2011-09-18 NOTE — Op Note (Signed)
NAMEBRITTNE, Alyssa Coleman              ACCOUNT NO.:  [id number]  MEDICAL RECORD NO.:  [id number]  LOCATION:  1524                         FACILITY:  [facility]  PHYSICIAN:  Madlyn Frankel. Charlann Boxer, M.D.  DATE OF BIRTH:  Jan 15, 1941  DATE OF PROCEDURE:  09/17/2011 DATE OF DISCHARGE:                              OPERATIVE REPORT   PREOPERATIVE DIAGNOSIS:  Left periprosthetic femur fracture, classified as a type B fracture, contained within the body of the prosthesis with a stable femoral component post radiographically.  POSTOPERATIVE DIAGNOSES: 1. Left periprosthetic femur fracture, classified as a type B     fracture, contained within the body of the prosthesis with a stable     femoral component post radiographically. 2. Status post left total hip replacement in 1997.  PROCEDURE: 1. Open reduction internal fixation of the left periprosthetic femur     fracture using 3 Zimmer cables and cancellous bone graft to augment     the femoral bone stock. 2. Revision left total hip replacement with removal of old     polyethylene insert and replacing with a new Stryker Crossfire 32,     10 degree liner to match the 54/56 cup as well as replacing the     femoral head with a 32 +5 new metal ball.  SURGEON:  Madlyn Frankel. Charlann Boxer, MD  ASSISTANT:  Lanney Gins, PA-C  Please note that Mr. Carmon Sails was present for the entirety of the case from preoperative position, perioperative retractor, replacements, leg management, retractor management, and general facilitation of the case as well as primary wound closure.  ANESTHESIA:  General.  SPECIMEN:  None.  COMPLICATIONS:  None apparent.  DRAINS:  One medium Hemovac.  BLOOD LOSS:  About 350 cc.  INDICATIONS FOR PROCEDURE:  Ms. Hughson is a 71 year old female with a history of rheumatoid arthritis, status post left total hip replacement in 1997.  She unfortunately had a fall coming out of a restaurant, landed on her left hip.  She had a lot of pain in her left  hip.  She was transferred from [facility] emergency room to [facility] for definitive care.  Radiographs were evaluated, these revealed a fracture along the medial aspect of the proximal femur.  Based on the appearance of the greater trochanter in relationship to the contralateral hip, the femoral stem did not appear to be loose or subsided and appeared to be stable.  I reviewed with Ms. Nickolson the possible need for open reduction internal fixation versus revision surgery of the femoral stem if it was found to be loose.  I also discussed given the fact she had the hip replacement in for 15 years that while we had a chance to we could go ahead and replace her acetabular liner with an updated acetabular liner to prevent further polyethylene wear with a newer generation of polyethylene.  We reviewed the risks of infection, nonunion, persistent problems with loosening of the femoral component, need for future revision surgery as well as standard risk of infection, DVT, and dislocation.  Consent was obtained for benefit of pain relief, fracture management, and dealing with the hip replacement.  PROCEDURE IN DETAIL:  The patient was brought to the operative theater. Once  adequate anesthesia, preoperative antibiotics, Ancef were administered, she was positioned into the right lateral decubitus position with the left side up.  The left lower extremity was then pre- scrubbed due to her 1-day hospital stay and then prepped and draped using DuraPrep in sterile fashion.  A time-out was performed identifying the patient, planned procedure, and extremity.  The patient's old incision was identified.  I extended the incision distally and excised the old scar.  Soft tissue planes were created to the iliotibial band and gluteal fascia identifying the old Ethibond sutures placed.  As was my preoperative plan, I incised the iliotibial band, exposing the vastus lateralis, and elevated the vastus  lateralis off the posterior intermuscular septum anteriorly.  At this point, I was able to identify the fracture site.  What this appeared to be was the broad insertion of the gluteus maximus, it may very well have contracted At the time of the impact and pulled over most of the portion of the bone over the posterior medial aspect of the hip.  I exposed the proximal hip as well and was able to internally and externally rotate the femur without evidence of any movement of the femoral stem.  Given these findings, I went ahead as planned and irrigated the wound and this area out.  I packed the bone graft in the area of the stem and beneath this fractured cortical bone.  I then passed 3 cables and tensioned them appropriately to reattach this fractured segment back to the shaft of the femur and the component with a bone graft stem to augment her femoral bone stock.  Following this, I irrigated this portion of the wound and now attended to the hip joint.  The posterior aspect of the hip was fully exposed. Again, I was able to assess for range of motion once the posterior aspect of the hip was exposed through a pseudocapsulotomy.  I dislocated the hip carefully.  We removed the femoral head and further assessed manually the stability of the femoral stem.  She previously had a 26 mm head in place, this head was removed.  Following further debridement, I was able to mobilize the femur anteriorly enough to remove the acetabular liner following further scar debridement and exposure of the rim of the cup.  We irrigated the cup itself out, which was noted to be stable as well and replaced this with a newer generation Crossfire 32, 10 degree liner, which was a previously 26, 10 degree liner.  This was impacted into position without difficulty.  We then subsequently chose a 32 +5 new metal ball and impacted this onto a clean and dry trunnion and reduced the hip.  The hip remained stable,  particularly the femoral side.  At this point, the hip was copiously irrigated with normal saline solution.  I placed the vastus lateralis over the top of the fracture site and reapproximated the iliotibial band in an interrupted running fashion.  I placed a medium Hemovac drain into the deep hip joint area.  I then reapproximated the gluteal fascia to itself with a #1 Vicryl.  The remaining of the wound was closed with 2-0 Vicryl and staples on the skin.  The skin was cleaned, dried, and dressed sterilely using a Mepilex dressing.  The drain site was dressed separately.  She was then extubated and brought to the recovery room.  At this point, she was placed in a knee immobilizer for comfort in the postoperative period.  I will allow her to  be partial weightbearing to allow for fracture healing.  We will plan for her to be in the hospital for another couple of days with plan for possible discharge at the end of the week.  She will return and follow up for staple removal and wound check.     Madlyn Frankel Charlann Boxer, M.D.     MDO/MEDQ  D:  09/17/2011  T:  09/18/2011  Job:  [id number]

## 2011-09-18 NOTE — Progress Notes (Signed)
Patient ID: Alyssa Coleman, female   DOB: 1941/03/18, 71 y.o.   MRN: [id number] Subjective: 1 Day Post-Op Procedure(s) (LRB): OPEN REDUCTION INTERNAL FIXATION (ORIF) PERIPROSTHETIC FRACTURE (Left)    Patient reports pain as moderate. Otherwise doing ok, no events overnight.  Getting up with therapy today, PWB  Objective:   VITALS:   Filed Vitals:   09/18/11 1055  BP: 130/75  Pulse:   Temp:   Resp:     Neurovascular intact. HV drain removed with some bloody drainage, otherwise dressing c/d/i  LABS  Basename 09/18/11 0417 09/17/11 0415  HGB 8.4* 10.5*  HCT 24.3* 31.6*  WBC 11.9* 13.5*  PLT 178 225     Basename 09/18/11 0417 09/17/11 0415  NA 135 135  K 3.9 3.7  BUN 9 9  CREATININE 0.45* 0.49*  GLUCOSE 143* 109*    No results found for this basename: LABPT:2,INR:2 in the last 72 hours   Assessment/Plan: 1 Day Post-Op Procedure(s) (LRB): OPEN REDUCTION INTERNAL FIXATION (ORIF) PERIPROSTHETIC FRACTURE (Left)   Advance diet Up with therapy Discharge to SNF, probably, tomorrow or Saturday depending on facility PWB 50% LLE

## 2011-09-18 NOTE — Progress Notes (Signed)
[facility] has SNF bed for pt Fri. Pt is aware but still considering returning home. CSW will meet with pt in am to assist with D/C planning.

## 2011-09-18 NOTE — Progress Notes (Signed)
FL2 in shadow chart for MD signature. Pt will consider ST SNF placement if [facility] has an opening. Awaiting return call from [facility]. CSw will follow to assist with D/C planning.

## 2011-09-18 NOTE — Progress Notes (Signed)
Physical Therapy Treatment Patient Details Name: MAKALEY STORTS MRN: [id number] DOB: [birth year] Today's Date: 09/18/2011  [id number] TA  PT Assessment/Plan  PT - Assessment/Plan Comments on Treatment Session: Pt requires total assist for transfers and bed mobility.  Continue to recommend SNF for pt at D/C.  PT Plan: Discharge plan remains appropriate PT Frequency: Min 6X/week Follow Up Recommendations: Skilled nursing facility Equipment Recommended: Rolling walker with 5" wheels;Wheelchair (measurements) PT Goals  Acute Rehab PT Goals PT Goal Formulation: With patient Time For Goal Achievement: 2 weeks Pt will go Supine/Side to Sit: with max assist PT Goal: Supine/Side to Sit - Progress: Goal set today Pt will go Sit to Supine/Side: with max assist PT Goal: Sit to Supine/Side - Progress: Progressing toward goal Pt will go Sit to Stand: with max assist PT Goal: Sit to Stand - Progress: Progressing toward goal Pt will go Stand to Sit: with max assist PT Goal: Stand to Sit - Progress: Progressing toward goal Pt will Transfer Bed to Chair/Chair to Bed: with max assist PT Transfer Goal: Bed to Chair/Chair to Bed - Progress: Progressing toward goal Pt will Ambulate: 1 - 15 feet;with other equipment (comment);with mod assist (PFRW) PT Goal: Ambulate - Progress: Goal set today Pt will Perform Home Exercise Program: with min assist PT Goal: Perform Home Exercise Program - Progress: Goal set today  PT Treatment Precautions/Restrictions  Precautions Precautions: Posterior Hip Precaution Booklet Issued: Yes (comment) Precaution Comments: Issued handout, educated pt on precautions Restrictions Weight Bearing Restrictions: Yes LLE Weight Bearing: Partial weight bearing Mobility (including Balance) Bed Mobility Bed Mobility: Yes Sit to Supine: 1: +2 Total assist;Patient percentage (comment);HOB flat (Pt assist 5%) Sit to Supine - Details (indicate cue type and reason): Requires  assist with B LE into bed and at trunk for controlle descent into bed.   Transfers Transfers: Yes Stand Pivot Transfers: 1: +2 Total assist;Patient percentage (comment) Stand Pivot Transfer Details (indicate cue type and reason): +2 for weight shift and balance, pt assist 10%.     Exercise  Total Joint Exercises Ankle Circles/Pumps: AROM;Both;10 reps Heel Slides: AAROM;Left;10 reps End of Session PT - End of Session Equipment Utilized During Treatment: Gait belt Activity Tolerance: Patient limited by fatigue Patient left: in bed;with call bell in reach;with family/visitor present General Behavior During Session: [facility] for tasks performed Cognition: [facility] for tasks performed  Page, Meribeth Mattes 09/18/2011, 3:50 PM

## 2011-09-19 ENCOUNTER — Encounter: Payer: Self-pay | Admitting: *Deleted

## 2011-09-19 ENCOUNTER — Inpatient Hospital Stay
Admission: RE | Admit: 2011-09-19 | Discharge: 2011-09-22 | Disposition: A | Discharge status: Other Acute Inpatient Hospital | Payer: Medicare Other | Source: Ambulatory Visit | Attending: Internal Medicine | Admitting: Internal Medicine

## 2011-09-19 DIAGNOSIS — T148XXA Other injury of unspecified body region, initial encounter: Principal | ICD-10-CM

## 2011-09-19 DIAGNOSIS — W19XXXA Unspecified fall, initial encounter: Secondary | ICD-10-CM

## 2011-09-19 LAB — CBC
HCT: 23.2 % — ABNORMAL LOW (ref 36.0–46.0)
Hemoglobin: 7.9 g/dL — ABNORMAL LOW (ref 12.0–15.0)
MCH: 32.2 pg (ref 26.0–34.0)
MCHC: 34.1 g/dL (ref 30.0–36.0)
RBC: 2.45 MIL/uL — ABNORMAL LOW (ref 3.87–5.11)

## 2011-09-19 LAB — BASIC METABOLIC PANEL
BUN: 12 mg/dL (ref 6–23)
CO2: 24 mEq/L (ref 19–32)
GFR calc non Af Amer: >90 mL/min (ref >90)
Glucose, Bld: 114 mg/dL — ABNORMAL HIGH (ref 70–99)
Potassium: 3.6 mEq/L (ref 3.5–5.1)
Sodium: 133 mEq/L — ABNORMAL LOW (ref 135–145)

## 2011-09-19 MED ORDER — DIPHENHYDRAMINE HCL 25 MG PO CAPS: 25.0000 mg | ORAL_CAPSULE | Freq: Four times a day (QID) | ORAL | Status: DC | PRN

## 2011-09-19 MED ORDER — POLYETHYLENE GLYCOL 3350 17 G PO PACK: 17.0000 g | PACK | Freq: Two times a day (BID) | ORAL | Status: AC

## 2011-09-19 MED ORDER — TRAMADOL HCL 50 MG PO TABS: 50.0000 mg | ORAL_TABLET | Freq: Four times a day (QID) | ORAL | Status: DC

## 2011-09-19 MED ORDER — FERROUS SULFATE 325 (65 FE) MG PO TABS: 325.0000 mg | ORAL_TABLET | Freq: Three times a day (TID) | ORAL | Status: DC

## 2011-09-19 MED ORDER — ACETAMINOPHEN 325 MG PO TABS: 650.0000 mg | ORAL_TABLET | Freq: Four times a day (QID) | ORAL | Status: DC | PRN

## 2011-09-19 MED ORDER — DSS 100 MG PO CAPS: 100.0000 mg | ORAL_CAPSULE | Freq: Two times a day (BID) | ORAL | Status: AC

## 2011-09-19 MED ORDER — ENOXAPARIN SODIUM 40 MG/0.4ML ~~LOC~~ SOLN: 40.0000 mg | SUBCUTANEOUS | Status: DC

## 2011-09-19 MED ORDER — METHOCARBAMOL 500 MG PO TABS: 500.0000 mg | ORAL_TABLET | Freq: Four times a day (QID) | ORAL | Status: DC | PRN

## 2011-09-19 MED ORDER — ASPIRIN 81 MG PO TABS: 81.0000 mg | ORAL_TABLET | Freq: Two times a day (BID) | ORAL | Status: AC

## 2011-09-19 NOTE — Progress Notes (Signed)
Subjective: 2 Days Post-Op Procedure(s) (LRB): OPEN REDUCTION INTERNAL FIXATION (ORIF) PERIPROSTHETIC FRACTURE (Left)   Patient reports pain as moderate. No events. Ready to be discharged to SNF.  Objective:   VITALS:   Filed Vitals:   09/19/11 0613  BP: 117/60  Pulse: 84  Temp: 98 F (36.7 C)  Resp: 18    Neurovascular intact Dorsiflexion/Plantar flexion intact Incision: dressing C/D/I No cellulitis present Compartment soft  LABS  Basename 09/19/11 0330 09/18/11 0417 09/17/11 0415  HGB 7.9* 8.4* 10.5*  HCT 23.2* 24.3* 31.6*  WBC 15.4* 11.9* 13.5*  PLT 207 178 225     Basename 09/19/11 0415 09/18/11 0417 09/17/11 0415  NA 133* 135 135  K 3.6 3.9 3.7  BUN 12 9 9   CREATININE 0.48* 0.45* 0.49*  GLUCOSE 114* 143* 109*     Assessment/Plan: 2 Days Post-Op Procedure(s) (LRB): OPEN REDUCTION INTERNAL FIXATION (ORIF) PERIPROSTHETIC FRACTURE (Left)   Up with therapy Discharge to SNF today After a discussion the pt states that she realizes that she will need more assistance at first than her husband can provide. She feels the best thing for her is to go to SNF. This thought is shared by her doctor.  Follow-up Information    Follow up with OLIN,Kaytie Ratcliffe D in 2 weeks.   Contact information:   [facility] [address] [place] [address] [phone]          Anastasio Auerbach. Jamira Barfuss   PAC  09/19/2011, 7:41 AM

## 2011-09-19 NOTE — Discharge Summary (Signed)
Physician Discharge Summary  Patient ID: Alyssa Coleman MRN: [id number] DOB/AGE: 03/15/1941 71 y.o.  Admit date: 09/16/2011 Discharge date: 09/19/2011  Procedures:  Procedure(s) (LRB): OPEN REDUCTION INTERNAL FIXATION (ORIF) PERIPROSTHETIC FRACTURE (Left)  Attending Physician: Shelda Pal, MD   Admission Diagnoses: Left hip peri-prosthetic fracture  Discharge Diagnoses:  Principal Problem:  *Periprosthetic fracture around internal prosthetic left hip joint   PONV (postoperative nausea and vomiting) Spinal headache   Shortness of breath   Angina   Hypertension   Blood transfusion   GERD   Arthritis   Anxiety   Myocardial infarction    HPI: Pt is a 71 y.o. female complaining of left hip pain since last night. She was at [employer] last night, as she was leaving she possibly tripped over a mat and fell. She fell directly on her left hip and had instant pain. She was brought to the ER in [place], [place] where x-rays revealed a left hip peri-prosthetic fracture. Pt then asked to be transferred to [facility] since her doctors are from the area. After having a discussion with the pt she understands that she will need surgery to fix the fracture. Risks, benefits and expectations were discussed with the patient. Patient understand the risks, benefits and expectations and wishes to proceed with an ORIF of the left peri-prosthetic fracture.  PCP: No primary provider on file.   Discharged Condition: good  Hospital Course:  Patient underwent the above stated procedure on 09/17/2011. Patient tolerated the procedure well and brought to the recovery room in good condition and subsequently to the floor.  POD #1 BP: 120/66 ; Pulse: 76 ; Temp: 98.6 F ; Resp: 18  Pt's foley was removed, as well as the hemovac drain removed. IV was changed to a saline lock. Patient reports pain as moderate. Otherwise doing ok, no events overnight. Getting up with therapy today,  PWB Neurovascular intact, dorsiflexion/plantar flexion intact, incision: dressing C/D/I, no cellulitis present and compartment soft.  LABS  Basename  09/18/11 0417    HGB  8.4*    HCT  24.3*     POD #2  BP: 117/60 ; Pulse: 84 ; Temp: 98 F ; Resp: 18  Patient reports pain as moderate. No events. Ready to be discharged to SNF. Neurovascular intact, dorsiflexion/plantar flexion intact, incision: dressing C/D/I, no cellulitis present and compartment soft.  LABS  Basename  09/19/11 0330     HGB  7.9*     HCT  23.2*       Discharge Exam: Extremities: Homans sign is negative, no sign of DVT, no edema, redness or tenderness in the calves or thighs and no ulcers, gangrene or trophic changes  Disposition: SNF with follow up in 2 weeks.  Follow-up Information    Follow up with OLIN,Ryan Ogborn D in 2 weeks.   Contact information:   [facility] [address] [place] [address] [phone]          Discharge Orders    Future Orders Please Complete By Expires   Diet - low sodium heart healthy      Call MD / Call 911      Comments:   If you experience chest pain or shortness of breath, CALL 911 and be transported to the hospital emergency room.  If you develope a fever above 101 F, pus (white drainage) or increased drainage or redness at the wound, or calf pain, call your surgeon's office.   Discharge instructions  Comments:   Daily dressing changes with gauze and tape. Keep the area dry and clean until follow up. Follow up in 2 weeks at [facility]. Call with any questions or concerns.     Constipation Prevention      Comments:   Drink plenty of fluids.  Prune juice may be helpful.  You may use a stool softener, such as Colace (over the counter) 100 mg twice a day.  Use MiraLax (over the counter) for constipation as needed.   Increase activity slowly as tolerated      Weight Bearing as taught in Physical Therapy       Comments:   Use a walker or crutches as instructed.   Driving restrictions      Comments:   No driving for 4 weeks   Change dressing      Comments:   Daily dressing changes with 4x4 guaze and tape. Keep the area dry and clean.   TED hose      Comments:   Use stockings (TED hose) for 2 weeks on both leg(s).  You may remove them at night for sleeping.      Current Discharge Medication List    START taking these medications   Details  diphenhydrAMINE (BENADRYL) 25 mg capsule Take 1 capsule (25 mg total) by mouth every 6 (six) hours as needed for itching, allergies or sleep.    docusate sodium 100 MG CAPS Take 100 mg by mouth 2 (two) times daily.    enoxaparin (LOVENOX) 40 MG/0.4ML SOLN Inject 0.4 mLs (40 mg total) into the skin daily. Qty: 14 Syringe, Refills: 0   Comments: Use for 14 days, then start taking the Aspirin 81 mg    ferrous sulfate 325 (65 FE) MG tablet Take 1 tablet (325 mg total) by mouth 3 (three) times daily after meals.    methocarbamol (ROBAXIN) 500 MG tablet Take 1 tablet (500 mg total) by mouth every 6 (six) hours as needed (muscle spasms). Qty: 50 tablet, Refills: 0    polyethylene glycol (MIRALAX / GLYCOLAX) packet Take 17 g by mouth 2 (two) times daily.    traMADol (ULTRAM) 50 MG tablet Take 1-2 tablets (50-100 mg total) by mouth every 6 (six) hours. Qty: 100 tablet, Refills: 0      CONTINUE these medications which have CHANGED   Details  acetaminophen (TYLENOL) 325 MG tablet Take 2 tablets (650 mg total) by mouth every 6 (six) hours as needed (or Fever >/= 101).    aspirin 81 MG tablet Take 1 tablet (81 mg total) by mouth 2 (two) times daily. Qty: 30 tablet   Comments: Start after finishing Lovenox      CONTINUE these medications which have NOT CHANGED   Details  Ascorbic Acid (VITAMIN C) 1000 MG tablet Take 1,000 mg by mouth daily.    Calcium Carbonate-Vitamin D (CALTRATE 600+D) 600-400 MG-UNIT per tablet Take 1 tablet by mouth daily.      cholecalciferol (VITAMIN D) 1000 UNITS tablet Take 1,000 Units by mouth daily.    fish oil-omega-3 fatty acids 1000 MG capsule Take 1 g by mouth 3 (three) times daily.    folic acid (FOLVITE) 1 MG tablet Take 1 mg by mouth daily.    lisinopril (PRINIVIL,ZESTRIL) 2.5 MG tablet Take 2.5 mg by mouth daily.    Multiple Vitamin (MULTIVITAMIN) capsule Take 1 capsule by mouth daily.    pantoprazole (PROTONIX) 40 MG tablet Take 40 mg by mouth daily as needed.    predniSONE (  STERAPRED UNI-PAK) 5 MG TABS Take 5 mg by mouth 1 day or 1 dose.          Signed: Anastasio Auerbach. Marigene Erler   PAC  09/19/2011, 7:54 AM

## 2011-09-19 NOTE — Progress Notes (Signed)
Physical Therapy Treatment Patient Details Name: Alyssa Coleman MRN: [id number] DOB: May 20, 1941 Today's Date: 09/19/2011 2TA PT Assessment/Plan  PT - Assessment/Plan Comments on Treatment Session: Pt requires total assist for transfers and bed mobility.  Continue to recommend SNF for pt at D/C.  PT Plan: Discharge plan remains appropriate Follow Up Recommendations: Skilled nursing facility Equipment Recommended: Defer to next venue PT Goals  Acute Rehab PT Goals PT Goal: Supine/Side to Sit - Progress: Progressing toward goal PT Goal: Sit to Supine/Side - Progress: Progressing toward goal PT Goal: Sit to Stand - Progress: Progressing toward goal PT Goal: Stand to Sit - Progress: Progressing toward goal PT Goal: Perform Home Exercise Program - Progress: Progressing toward goal  PT Treatment Precautions/Restrictions  Precautions Precautions: Posterior Hip Precaution Booklet Issued: Yes (comment) Precaution Comments: Issued handout, educated pt on precautions Restrictions Weight Bearing Restrictions: Yes LLE Weight Bearing: Partial weight bearing Mobility (including Balance) Bed Mobility Supine to Sit: 1: +2 Total assist;Patient percentage (comment);HOB elevated (Comment degrees) Supine to Sit Details (indicate cue type and reason): HOB up 45*, pt = 30%, cues for technique and THP;  Sitting - Scoot to Edge of Bed: 1: +2 Total assist Sitting - Scoot to Edge of Bed Details (indicate cue type and reason): +2 for wt shift, scooting; safety Sit to Supine: 1: +2 Total assist;Patient percentage (comment);HOB flat Sit to Supine - Details (indicate cue type and reason): Requires assist with B LE into bed and at trunk for controlle descent into bed. Pt=10 Transfers Sit to Stand: 1: +2 Total assist;From bed;Patient percentage (comment);From elevated surface Sit to Stand Details (indicate cue type and reason): +2 for anterior-superior wt shift. bed ht elevated due to THP and pt unable to flex  right knee (at baseline) Pt =15% Stand to Sit: 1: +1 Total assist;Patient percentage (comment);To bed Stand to Sit Details: pt =5%; +2 for contol of descent, HOH to bring UEs off of RW platforms Stand Pivot Transfers:  (deferred OOB to chair d/t early D/C sched today; ) Ambulation/Gait Ambulation/Gait: No Stairs:  (pt unable at this time)    Exercise  General Exercises - Lower Extremity Ankle Circles/Pumps: AROM;Both;AAROM;10 reps Quad Sets: AROM;Both;10 reps Heel Slides: AAROM;10 reps;Left Hip ABduction/ADduction: AAROM;10 reps;Left End of Session PT - End of Session Equipment Utilized During Treatment: Gait belt Activity Tolerance: Patient limited by fatigue Patient left: in bed;with call bell in reach;with family/visitor present General Behavior During Session: [facility] for tasks performed Cognition: [facility] for tasks performed  [facility] 09/19/2011, 10:08 AM

## 2011-09-19 NOTE — Evaluation (Signed)
OT Cancellation Note  Treatment cancelled today due to:  Pt with d/c orders in chart noted w/ plans to d/c to SNF. Defer OT assessment to SNF, sign off OT at this time.    Mariam Dollar Beth Dixon 09/19/2011, 8:12 AM

## 2011-09-22 ENCOUNTER — Emergency Department (HOSPITAL_COMMUNITY)
Admission: EM | Admit: 2011-09-22 | Discharge: 2011-09-22 | Disposition: A | Discharge status: Home / Self Care | Payer: Medicare Other | Attending: Emergency Medicine | Admitting: Emergency Medicine

## 2011-09-22 ENCOUNTER — Ambulatory Visit (HOSPITAL_COMMUNITY): Payer: Medicare Other

## 2011-09-22 ENCOUNTER — Inpatient Hospital Stay
Admission: RE | Admit: 2011-09-22 | Discharge: 2011-10-27 | Disposition: A | Discharge status: Home Health | Payer: Medicare Other | Source: Ambulatory Visit | Attending: Internal Medicine | Admitting: Internal Medicine

## 2011-09-22 ENCOUNTER — Encounter (HOSPITAL_COMMUNITY): Payer: Self-pay | Admitting: *Deleted

## 2011-09-22 DIAGNOSIS — S42402A Unspecified fracture of lower end of left humerus, initial encounter for closed fracture: Secondary | ICD-10-CM

## 2011-09-22 DIAGNOSIS — Z79899 Other long term (current) drug therapy: Secondary | ICD-10-CM | POA: Insufficient documentation

## 2011-09-22 DIAGNOSIS — W19XXXA Unspecified fall, initial encounter: Secondary | ICD-10-CM | POA: Insufficient documentation

## 2011-09-22 DIAGNOSIS — Y9210 Unspecified residential institution as the place of occurrence of the external cause: Secondary | ICD-10-CM | POA: Insufficient documentation

## 2011-09-22 DIAGNOSIS — IMO0002 Reserved for concepts with insufficient information to code with codable children: Secondary | ICD-10-CM | POA: Insufficient documentation

## 2011-09-22 DIAGNOSIS — S52023A Displaced fracture of olecranon process without intraarticular extension of unspecified ulna, initial encounter for closed fracture: Secondary | ICD-10-CM | POA: Insufficient documentation

## 2011-09-22 DIAGNOSIS — K219 Gastro-esophageal reflux disease without esophagitis: Secondary | ICD-10-CM | POA: Insufficient documentation

## 2011-09-22 DIAGNOSIS — I10 Essential (primary) hypertension: Secondary | ICD-10-CM | POA: Insufficient documentation

## 2011-09-22 DIAGNOSIS — M129 Arthropathy, unspecified: Secondary | ICD-10-CM | POA: Insufficient documentation

## 2011-09-22 DIAGNOSIS — Z7982 Long term (current) use of aspirin: Secondary | ICD-10-CM | POA: Insufficient documentation

## 2011-09-22 DIAGNOSIS — I252 Old myocardial infarction: Secondary | ICD-10-CM | POA: Insufficient documentation

## 2011-09-22 DIAGNOSIS — Z96659 Presence of unspecified artificial knee joint: Secondary | ICD-10-CM | POA: Insufficient documentation

## 2011-09-22 DIAGNOSIS — M25529 Pain in unspecified elbow: Secondary | ICD-10-CM | POA: Insufficient documentation

## 2011-09-22 DIAGNOSIS — F411 Generalized anxiety disorder: Secondary | ICD-10-CM | POA: Insufficient documentation

## 2011-09-22 NOTE — ED Provider Notes (Signed)
History     CSN: [id number]  Arrival date & time 09/22/11  2038   First MD Initiated Contact with Patient 09/22/11 2109      Chief Complaint  Patient presents with  . Arm Injury    (Consider location/radiation/quality/duration/timing/severity/associated sxs/prior treatment) HPI Alyssa Coleman is a 71 y.o. female presents with c/o left elbow pain leading to desire to be assessed in the ED. The sx(s) have been present for 6 days. Additional concerns are increased pain, left elbow with movement. Causative factors are fall 6 days ago. Palliative factors are rest helps. The distress associated is mild. The disorder has been present for 6 days. She was injured in an accidental fall 6 days ago. She suffered a hip fracture and was admitted to the hospital for repair. She was since in the nursing home. She has noticed left elbow pain with movement and attempts at physical therapy. She denies additional trauma.   Past Medical History  Diagnosis Date  . PONV (postoperative nausea and vomiting)   . Spinal headache   . Shortness of breath   . Angina   . Hypertension   . Blood transfusion   . GERD (gastroesophageal reflux disease)   . Arthritis   . Anxiety   . Myocardial infarction     Past Surgical History  Procedure Date  . 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 Total hip arthroplasty aug 97    right hip  . Total knee arthroplasty 1998    left  . Hand surgery 1980    skin infection  . Cardiac catheterization   . Joint replacement   . Orif periprosthetic fracture 09/17/2011    Procedure: OPEN REDUCTION INTERNAL FIXATION (ORIF) PERIPROSTHETIC FRACTURE;  Surgeon: Shelda Pal, MD;  Location: [facility];  Service: Orthopedics;  Laterality: Left;    History reviewed. No pertinent family history.  History  Substance Use Topics  . Smoking status: Never Smoker   . Smokeless tobacco: Not on file  . Alcohol Use: No    OB History    Grav Para Term Preterm Abortions TAB SAB Ect  Mult Living                  Review of Systems  All other systems reviewed and are negative.    Allergies  Adult aspirin ec; Codeine; and Hydrocodone  Home Medications   Current Outpatient Rx  Name Route Sig Dispense Refill  . VITAMIN C 1000 MG PO TABS Oral Take 1,000 mg by mouth daily.    . ASPIRIN 81 MG PO TABS Oral Take 1 tablet (81 mg total) by mouth 2 (two) times daily. 30 tablet     Start after finishing Lovenox  . CALCIUM CARBONATE-VITAMIN D 600-400 MG-UNIT PO TABS Oral Take 1 tablet by mouth daily.    Marland Kitchen VITAMIN D 1000 UNITS PO TABS Oral Take 1,000 Units by mouth daily.    . DSS 100 MG PO CAPS Oral Take 100 mg by mouth 2 (two) times daily.    Marland Kitchen ENOXAPARIN SODIUM 40 MG/0.4ML  SOLN Subcutaneous Inject 40 mg into the skin daily.    Marland Kitchen FERROUS SULFATE 325 (65 FE) MG PO TABS Oral Take 1 tablet (325 mg total) by mouth 3 (three) times daily after meals.    . OMEGA-3 FATTY ACIDS 1000 MG PO CAPS Oral Take 1 g by mouth 3 (three) times daily.    Marland Kitchen FOLIC ACID 1 MG PO TABS Oral Take 1 mg  by mouth daily.    Marland Kitchen LISINOPRIL 2.5 MG PO TABS Oral Take 2.5 mg by mouth daily.    . ADULT MULTIVITAMIN W/MINERALS CH Oral Take 1 tablet by mouth daily.    Marland Kitchen OMEPRAZOLE 20 MG PO CPDR Oral Take 20 mg by mouth daily.    Marland Kitchen PROSTAT PO Oral Take 30 mLs by mouth 2 (two) times daily. PROSTAT AWC    . POLYETHYLENE GLYCOL 3350 PO PACK Oral Take 17 g by mouth 2 (two) times daily.    Marland Kitchen PREDNISONE (PAK) 5 MG PO TABS Oral Take 5 mg by mouth daily.     . TRAMADOL HCL 50 MG PO TABS Oral Take 50-100 mg by mouth every 6 (six) hours as needed. For mild to moderate pain      BP 158/51  Pulse 103  Temp(Src) 98 F (36.7 C) (Oral)  Resp 20  Ht 5\' 7"  (1.702 m)  Wt 153 lb (69.4 kg)  BMI 23.96 kg/m2  SpO2 94%  Physical Exam  Nursing note and vitals reviewed. Constitutional: She is oriented to person, place, and time. She appears well-developed and well-nourished.  HENT:  Head: Normocephalic and atraumatic.    Eyes: Conjunctivae are normal.  Neck: Normal range of motion and phonation normal.  Cardiovascular: Normal rate.   Pulmonary/Chest: Effort normal. She exhibits no tenderness.  Musculoskeletal:       Left elbow is tender and swollen. She lacks full range of motion, secondary to pain. His neurovascular intact distally in the left hand  Neurological: She is alert and oriented to person, place, and time. She has normal strength. She exhibits normal muscle tone. Coordination normal.  Skin: Skin is warm and dry.  Psychiatric: She has a normal mood and affect. Her behavior is normal. Judgment and thought content normal.    ED Course  Procedures (including critical care time)  Labs Reviewed - No data to display Dg Elbow Complete Left  09/22/2011  *RADIOLOGY REPORT*  Clinical Data: Fall.  Evaluate for fracture.  History of rheumatoid arthritis.  LEFT ELBOW - COMPLETE 3+ VIEW  Comparison: None.  Findings: Severe deformity of the left elbow due to inflammatory arthropathy changes, presumably rheumatoid.  Complete erosion of the radial head.  Near complete erosion of the proximal ulna as well.  The olecranon process is still present and there is a fracture near the base of the olecranon process, minimally- displaced.  Deformity of the distal humerus without acute bony abnormality.  IMPRESSION: Severe deformity of the left elbow due to inflammatory arthropathy, likely rheumatoid arthritis.  Minimally-displaced fracture through the olecranon process of the left proximal ulna.  Original Report Authenticated By: Cyndie Chime, M.D.     1. Fracture of left elbow       MDM  Subacute left elbow fracture, not require reduction and ulikely to require surgical intervention. Patient is treated with posterior splint in emergency department for comfort. She can followup with her orthopedist as scheduled next week. Instructions given to avoid left elbow with physical therapy.        Flint Melter,  MD 09/22/11 2214

## 2011-09-22 NOTE — ED Notes (Signed)
Report called to [place] at [facility].  Staff from [facility] to pick up.

## 2011-09-22 NOTE — ED Notes (Signed)
Pt fell a week ago and fx'd left hip, also had c/o left elbow pain but no one xray it until today, per [facility] center staff reported that results was positive for a fx to left elbow

## 2011-09-22 NOTE — Progress Notes (Signed)
Pt d/c to [facility] via [facility] transport on 1/25 for ST SN[facility] placement.

## 2011-09-22 NOTE — ED Notes (Signed)
Pt DC to [facility] via stretcher

## 2013-04-19 ENCOUNTER — Emergency Department (HOSPITAL_COMMUNITY): Payer: Medicare Other

## 2013-04-19 ENCOUNTER — Encounter (HOSPITAL_COMMUNITY): Payer: Self-pay | Admitting: Emergency Medicine

## 2013-04-19 ENCOUNTER — Emergency Department (HOSPITAL_COMMUNITY)
Admission: EM | Admit: 2013-04-19 | Discharge: 2013-04-19 | Disposition: A | Discharge status: Home / Self Care | Payer: Medicare Other | Attending: Emergency Medicine | Admitting: Emergency Medicine

## 2013-04-19 DIAGNOSIS — Z7982 Long term (current) use of aspirin: Secondary | ICD-10-CM | POA: Insufficient documentation

## 2013-04-19 DIAGNOSIS — Y92009 Unspecified place in unspecified non-institutional (private) residence as the place of occurrence of the external cause: Secondary | ICD-10-CM | POA: Insufficient documentation

## 2013-04-19 DIAGNOSIS — W1809XA Striking against other object with subsequent fall, initial encounter: Secondary | ICD-10-CM | POA: Insufficient documentation

## 2013-04-19 DIAGNOSIS — W19XXXA Unspecified fall, initial encounter: Secondary | ICD-10-CM

## 2013-04-19 DIAGNOSIS — Y939 Activity, unspecified: Secondary | ICD-10-CM | POA: Insufficient documentation

## 2013-04-19 DIAGNOSIS — Z8659 Personal history of other mental and behavioral disorders: Secondary | ICD-10-CM | POA: Insufficient documentation

## 2013-04-19 DIAGNOSIS — S51809A Unspecified open wound of unspecified forearm, initial encounter: Secondary | ICD-10-CM | POA: Insufficient documentation

## 2013-04-19 DIAGNOSIS — K219 Gastro-esophageal reflux disease without esophagitis: Secondary | ICD-10-CM | POA: Insufficient documentation

## 2013-04-19 DIAGNOSIS — I10 Essential (primary) hypertension: Secondary | ICD-10-CM | POA: Insufficient documentation

## 2013-04-19 DIAGNOSIS — I252 Old myocardial infarction: Secondary | ICD-10-CM | POA: Insufficient documentation

## 2013-04-19 DIAGNOSIS — Z8669 Personal history of other diseases of the nervous system and sense organs: Secondary | ICD-10-CM | POA: Insufficient documentation

## 2013-04-19 DIAGNOSIS — I959 Hypotension, unspecified: Secondary | ICD-10-CM | POA: Insufficient documentation

## 2013-04-19 DIAGNOSIS — IMO0002 Reserved for concepts with insufficient information to code with codable children: Secondary | ICD-10-CM | POA: Insufficient documentation

## 2013-04-19 DIAGNOSIS — M129 Arthropathy, unspecified: Secondary | ICD-10-CM | POA: Insufficient documentation

## 2013-04-19 DIAGNOSIS — Z79899 Other long term (current) drug therapy: Secondary | ICD-10-CM | POA: Insufficient documentation

## 2013-04-19 DIAGNOSIS — S42209A Unspecified fracture of upper end of unspecified humerus, initial encounter for closed fracture: Secondary | ICD-10-CM | POA: Insufficient documentation

## 2013-04-19 DIAGNOSIS — S51811A Laceration without foreign body of right forearm, initial encounter: Secondary | ICD-10-CM

## 2013-04-19 DIAGNOSIS — I209 Angina pectoris, unspecified: Secondary | ICD-10-CM | POA: Insufficient documentation

## 2013-04-19 DIAGNOSIS — S42201A Unspecified fracture of upper end of right humerus, initial encounter for closed fracture: Secondary | ICD-10-CM

## 2013-04-19 MED ORDER — TRAMADOL HCL 50 MG PO TABS
50.0000 mg | ORAL_TABLET | Freq: Once | ORAL | Status: AC
  Administered 2013-04-19: 50 mg via ORAL
  Filled 2013-04-19: qty 1

## 2013-04-19 MED ORDER — ONDANSETRON HCL 4 MG/2ML IJ SOLN
4.0000 mg | Freq: Once | INTRAMUSCULAR | Status: AC
  Administered 2013-04-19: 4 mg via INTRAVENOUS
  Filled 2013-04-19: qty 2

## 2013-04-19 MED ORDER — SODIUM CHLORIDE 0.9 % IV SOLN
1000.0000 mL | INTRAVENOUS | Status: DC
  Administered 2013-04-19: 1000 mL via INTRAVENOUS

## 2013-04-19 MED ORDER — MORPHINE SULFATE 4 MG/ML IJ SOLN
4.0000 mg | Freq: Once | INTRAMUSCULAR | Status: AC
  Administered 2013-04-19: 4 mg via INTRAVENOUS
  Filled 2013-04-19: qty 1

## 2013-04-19 MED ORDER — TRAMADOL HCL 50 MG PO TABS: 50.0000 mg | ORAL_TABLET | Freq: Four times a day (QID) | ORAL | Status: DC | PRN

## 2013-04-19 MED ORDER — SODIUM CHLORIDE 0.9 % IV SOLN
1000.0000 mL | Freq: Once | INTRAVENOUS | Status: AC
  Administered 2013-04-19: 1000 mL via INTRAVENOUS

## 2013-04-19 NOTE — ED Notes (Signed)
Bed: WA20 Expected date: 04/19/13 Expected time: 4:55 AM Means of arrival: Ambulance Comments: Fall, shoulder pain

## 2013-04-19 NOTE — ED Notes (Signed)
MD at bedside speaking to pt and family at this time  

## 2013-04-19 NOTE — ED Notes (Signed)
Pt removed from LSB, reports pain to lower thoracic spine.  Denies all other locations of pain upon palpation other than R shoulder and elbow.

## 2013-04-19 NOTE — ED Provider Notes (Signed)
CSN: [id number]     Arrival date & time 04/19/13  0510 History   First MD Initiated Contact with Patient 04/19/13 [phone]     Chief Complaint  Patient presents with  . Fall  . Shoulder Pain   (Consider location/radiation/quality/duration/timing/severity/associated sxs/prior Treatment) Patient is a 72 y.o. female presenting with fall and shoulder pain. The history is provided by the patient.  Fall  Shoulder Pain  She fell as she tried to sit down on her commode and injured her right shoulder and right wrist. Last tetanus immunization was within the past year. Pain in the shoulder is severe and she is unable to give it a number. She denies head or neck injury. She does have history of rheumatoid arthritis. She denies back, chest, abdomen, and the leg injury.  Past Medical History  Diagnosis Date  . PONV (postoperative nausea and vomiting)   . Spinal headache   . Shortness of breath   . Angina   . Hypertension   . Blood transfusion   . GERD (gastroesophageal reflux disease)   . Arthritis   . Anxiety   . Myocardial infarction    Past Surgical History  Procedure Laterality Date  . Total hip arthroplasty  Feb 97    left  . Total hip arthroplasty  aug 97    right hip  . Total knee arthroplasty  1998    left  . Hand surgery  1980    skin infection  . Cardiac catheterization    . Joint replacement    . Orif periprosthetic fracture  09/17/2011    Procedure: OPEN REDUCTION INTERNAL FIXATION (ORIF) PERIPROSTHETIC FRACTURE;  Surgeon: Shelda Pal, MD;  Location: [facility];  Service: Orthopedics;  Laterality: Left;   History reviewed. No pertinent family history. History  Substance Use Topics  . Smoking status: Never Smoker   . Smokeless tobacco: Not on file  . Alcohol Use: No   OB History   Grav Para Term Preterm Abortions TAB SAB Ect Mult Living                 Review of Systems  All other systems reviewed and are negative.    Allergies  Adult aspirin ec; Codeine; and  Hydrocodone  Home Medications   Current Outpatient Rx  Name  Route  Sig  Dispense  Refill  . Ascorbic Acid (VITAMIN C) 1000 MG tablet   Oral   Take 1,000 mg by mouth daily.         Marland Kitchen aspirin 81 MG tablet   Oral   Take 1 tablet (81 mg total) by mouth 2 (two) times daily.   30 tablet        Start after finishing Lovenox   . Calcium Carbonate-Vitamin D (CALTRATE 600+D) 600-400 MG-UNIT per tablet   Oral   Take 1 tablet by mouth daily.         . cholecalciferol (VITAMIN D) 1000 UNITS tablet   Oral   Take 1,000 Units by mouth daily.         . fish oil-omega-3 fatty acids 1000 MG capsule   Oral   Take 1 g by mouth 3 (three) times daily.         . folic acid (FOLVITE) 1 MG tablet   Oral   Take 1 mg by mouth daily.         Marland Kitchen lisinopril (PRINIVIL,ZESTRIL) 5 MG tablet   Oral   Take 5 mg by mouth daily.         Marland Kitchen  Multiple Vitamin (MULITIVITAMIN WITH MINERALS) TABS   Oral   Take 1 tablet by mouth daily.         Marland Kitchen omeprazole (PRILOSEC) 20 MG capsule   Oral   Take 20 mg by mouth daily as needed.          . predniSONE (DELTASONE) 5 MG tablet   Oral   Take 10 mg by mouth daily.         Marland Kitchen thyroid (ARMOUR) 15 MG tablet   Oral   Take 15 mg by mouth 2 (two) times daily. 22.5 mg in the morning  15mg  in PM          BP 96/73  Pulse 92  Temp(Src) 97.7 F (36.5 C) (Oral)  Resp 22  SpO2 94% Physical Exam  Nursing note and vitals reviewed.  72 year old female, resting comfortably and in no acute distress. Vital signs are significant for hypotension with blood pressure 81/45, and tachypnea with respiratory rate of 22. Oxygen saturation is 94%, which is normal. Head is normocephalic and atraumatic. PERRLA, EOMI. Oropharynx is clear. Neck immobilized in a stiff cervical collar and is nontender without adenopathy or JVD. Back is nontender and there is no CVA tenderness. Lungs are clear without rales, wheezes, or rhonchi. Chest is nontender. Heart has regular  rate and rhythm without murmur. Abdomen is soft, flat, nontender without masses or hepatosplenomegaly and peristalsis is normoactive. Extremities: There is soft tissue swelling and ecchymosis of the right shoulder without tenderness rather diffusely throughout the shoulder area and pain with any passive range of motion. Skin tears noted on the dorsum of the right wrist without swelling or deformity. No other extremity injuries seen. There is no cyanosis or edema. Skin is warm and dry without rash. Neurologic: Mental status is normal, cranial nerves are intact, there are no motor or sensory deficits.  ED Course  Procedures (including critical care time)  Imaging Review Dg Shoulder Right  04/19/2013   CLINICAL DATA:  Fall. Shoulder injury and pain.  EXAM: RIGHT SHOULDER - 2+ VIEW  COMPARISON:  None.  FINDINGS: Comminuted right humeral neck fracture is seen with impaction and medial displacement. No evidence of shoulder dislocation. Severe osteopenia noted.  IMPRESSION: Comminuted right humeral neck fracture. Severe osteopenia.   Electronically Signed   By: Myles Rosenthal   On: 04/19/2013 07:16   Dg Wrist Complete Right  04/19/2013   CLINICAL DATA:  Fall. Wrist injury and pain.  EXAM: RIGHT WRIST - COMPLETE 3+ VIEW  COMPARISON:  None.  FINDINGS: Severe osteopenia is demonstrated. Severe wrist arthritis is seen with collapse of the proximal carpal row and erosion of the distal ulna. This is consistent with chronic erosive arthropathy, likely rheumatoid arthritis. No acute fracture or dislocation identified.  IMPRESSION: No acute findings.  Severe osteopenia, and erosive arthropathy suspicious for rheumatoid arthritis.   Electronically Signed   By: Myles Rosenthal   On: 04/19/2013 07:19   Ct Head Wo Contrast  04/19/2013   *RADIOLOGY REPORT*  Clinical Data:  Fall  CT HEAD WITHOUT CONTRAST CT CERVICAL SPINE WITHOUT CONTRAST  Technique:  Multidetector CT imaging of the head and cervical spine was performed  following the standard protocol without intravenous contrast.  Multiplanar CT image reconstructions of the cervical spine were also generated.  Comparison:  12/18/2006 head CT  CT HEAD  Findings: Progression of subcortical and periventricular white matter hypodensities, a nonspecific finding often seen in the setting of chronic microangiopathic change.  In addition, a  more confluent hypodense area adjacent to the occipital horn left lateral ventricle on image 18 series 3. Prominence of the sulci, cisterns, and ventricles, in keeping with diffuse cerebral volume loss.  No intraparenchymal hemorrhage, mass effect, or abnormal extra-axial fluid collection. The visualized paranasal sinuses and mastoid air cells are predominately clear.  Postoperative changes right globe.  IMPRESSION: White matter changes have progressed in the interval and are most in keeping with chronic microangiopathic change.  No confluent hypodensity adjacent to the occipital horn left lateral ventricle may reflect the same process or sequelae of prior infarction.  No mass effect.  CT CERVICAL SPINE  Findings: Prominent occipital condyles with lateral mass C1 thinning.  Left atlanto-occipital fusion and cranial settling. Contact between the tip of the dens and the clivus, with associated degenerative changes.  Grade 1 retrolisthesis of C3 on C4.  Mild superior endplate compression at T11.  Exaggerated cervical lordosis.  Paravertebral soft tissues within normal limits. Biapical scarring.  Atherosclerotic vascular calcifications.  IMPRESSION: Cranial settling with chronic impaction of the dens against the clivus. Left atlanto-occipital fusion.  These may reflect congenital changes with superimposed arthritis given abnormal mechanics, remote trauma, or other arthritides such as rheumatoid arthritis.  Grade 1 retrolisthesis of C3 on C4.  Mild superior endplate height loss at T1 is age indeterminate, though favored remote.  Correlate for point  tenderness.   Original Report Authenticated By: Jearld Lesch, M.D.   Ct Cervical Spine Wo Contrast  04/19/2013   *RADIOLOGY REPORT*  Clinical Data:  Fall  CT HEAD WITHOUT CONTRAST CT CERVICAL SPINE WITHOUT CONTRAST  Technique:  Multidetector CT imaging of the head and cervical spine was performed following the standard protocol without intravenous contrast.  Multiplanar CT image reconstructions of the cervical spine were also generated.  Comparison:  12/18/2006 head CT  CT HEAD  Findings: Progression of subcortical and periventricular white matter hypodensities, a nonspecific finding often seen in the setting of chronic microangiopathic change.  In addition, a more confluent hypodense area adjacent to the occipital horn left lateral ventricle on image 18 series 3. Prominence of the sulci, cisterns, and ventricles, in keeping with diffuse cerebral volume loss.  No intraparenchymal hemorrhage, mass effect, or abnormal extra-axial fluid collection. The visualized paranasal sinuses and mastoid air cells are predominately clear.  Postoperative changes right globe.  IMPRESSION: White matter changes have progressed in the interval and are most in keeping with chronic microangiopathic change.  No confluent hypodensity adjacent to the occipital horn left lateral ventricle may reflect the same process or sequelae of prior infarction.  No mass effect.  CT CERVICAL SPINE  Findings: Prominent occipital condyles with lateral mass C1 thinning.  Left atlanto-occipital fusion and cranial settling. Contact between the tip of the dens and the clivus, with associated degenerative changes.  Grade 1 retrolisthesis of C3 on C4.  Mild superior endplate compression at T11.  Exaggerated cervical lordosis.  Paravertebral soft tissues within normal limits. Biapical scarring.  Atherosclerotic vascular calcifications.  IMPRESSION: Cranial settling with chronic impaction of the dens against the clivus. Left atlanto-occipital fusion.  These  may reflect congenital changes with superimposed arthritis given abnormal mechanics, remote trauma, or other arthritides such as rheumatoid arthritis.  Grade 1 retrolisthesis of C3 on C4.  Mild superior endplate height loss at T1 is age indeterminate, though favored remote.  Correlate for point tenderness.   Original Report Authenticated By: Jearld Lesch, M.D.   Images viewed by me.  MDM   1. Fall at home,  initial encounter   2. Fracture of proximal humerus, right, closed, initial encounter   3. Skin tear of forearm without complication, right, initial encounter   4. Hypotension    Fall with a right shoulder injury worrisome for fracture. Skin tear of right wrist will need only symptomatic treatment. She will be sent for x-rays of the shoulder and wrist and CT will be obtained of head and cervical spine to rule out occult injury in those locations. Hypotension is probably at least partly vagal. She'll be given IV fluids and IV narcotics and antiemetics.  X-ray confirms comminuted proximal humerus fracture. No fractures are seen in the other x-rays and CT of head and cervical spine are unremarkable. Blood pressures come up with IV fluids and has remained stable at about 125 systolic. She is placed in a sling for comfort and referred back to her orthopedic surgeons at [facility] orthopedics. She is discharged with a prescription for tramadol which she states she is able to tolerate in spite of allergies to codeine and hydrocodone.  Dione Booze, MD 04/19/13 [phone]

## 2013-04-19 NOTE — ED Notes (Signed)
Per EMS, pt fell while attempting to void in the BR, hit her R arm on her walker as she fell down.  Pt found lying on right side, has a skin tear to the top of her R hand, bleeding controlled with bandage at this time.  Pt received 4mg  Zofran for nausea en route, refused pain medications.

## 2015-09-06 DIAGNOSIS — Z23 Encounter for immunization: Secondary | ICD-10-CM | POA: Diagnosis not present

## 2015-09-06 DIAGNOSIS — E039 Hypothyroidism, unspecified: Secondary | ICD-10-CM | POA: Diagnosis not present

## 2015-09-06 DIAGNOSIS — I10 Essential (primary) hypertension: Secondary | ICD-10-CM | POA: Diagnosis not present

## 2015-09-06 DIAGNOSIS — E7800 Pure hypercholesterolemia, unspecified: Secondary | ICD-10-CM | POA: Diagnosis not present

## 2015-09-10 DIAGNOSIS — E039 Hypothyroidism, unspecified: Secondary | ICD-10-CM | POA: Diagnosis not present

## 2015-09-10 DIAGNOSIS — I10 Essential (primary) hypertension: Secondary | ICD-10-CM | POA: Diagnosis not present

## 2015-09-10 DIAGNOSIS — M810 Age-related osteoporosis without current pathological fracture: Secondary | ICD-10-CM | POA: Diagnosis not present

## 2015-11-13 DIAGNOSIS — M0579 Rheumatoid arthritis with rheumatoid factor of multiple sites without organ or systems involvement: Secondary | ICD-10-CM | POA: Diagnosis not present

## 2015-11-13 DIAGNOSIS — M150 Primary generalized (osteo)arthritis: Secondary | ICD-10-CM | POA: Diagnosis not present

## 2015-11-13 DIAGNOSIS — M810 Age-related osteoporosis without current pathological fracture: Secondary | ICD-10-CM | POA: Diagnosis not present

## 2016-05-08 DIAGNOSIS — E7800 Pure hypercholesterolemia, unspecified: Secondary | ICD-10-CM | POA: Diagnosis not present

## 2016-05-08 DIAGNOSIS — E039 Hypothyroidism, unspecified: Secondary | ICD-10-CM | POA: Diagnosis not present

## 2016-05-08 DIAGNOSIS — I10 Essential (primary) hypertension: Secondary | ICD-10-CM | POA: Diagnosis not present

## 2016-05-15 DIAGNOSIS — N390 Urinary tract infection, site not specified: Secondary | ICD-10-CM | POA: Diagnosis not present

## 2016-05-19 DIAGNOSIS — R21 Rash and other nonspecific skin eruption: Secondary | ICD-10-CM | POA: Diagnosis not present

## 2016-05-19 DIAGNOSIS — I10 Essential (primary) hypertension: Secondary | ICD-10-CM | POA: Diagnosis not present

## 2016-05-19 DIAGNOSIS — Z23 Encounter for immunization: Secondary | ICD-10-CM | POA: Diagnosis not present

## 2016-05-19 DIAGNOSIS — E7800 Pure hypercholesterolemia, unspecified: Secondary | ICD-10-CM | POA: Diagnosis not present

## 2016-05-19 DIAGNOSIS — E039 Hypothyroidism, unspecified: Secondary | ICD-10-CM | POA: Diagnosis not present

## 2016-10-02 ENCOUNTER — Encounter (HOSPITAL_COMMUNITY): Payer: Self-pay | Admitting: *Deleted

## 2016-10-02 ENCOUNTER — Inpatient Hospital Stay (HOSPITAL_COMMUNITY)
Admission: EM | Admit: 2016-10-02 | Discharge: 2016-10-07 | DRG: 871 | Disposition: A | Discharge status: Skilled Nursing Facility | Payer: PPO | Attending: Internal Medicine | Admitting: Internal Medicine

## 2016-10-02 ENCOUNTER — Inpatient Hospital Stay (HOSPITAL_COMMUNITY): Payer: PPO

## 2016-10-02 ENCOUNTER — Emergency Department (HOSPITAL_COMMUNITY): Payer: PPO

## 2016-10-02 DIAGNOSIS — J9811 Atelectasis: Secondary | ICD-10-CM | POA: Diagnosis not present

## 2016-10-02 DIAGNOSIS — Z7952 Long term (current) use of systemic steroids: Secondary | ICD-10-CM | POA: Diagnosis not present

## 2016-10-02 DIAGNOSIS — Z515 Encounter for palliative care: Secondary | ICD-10-CM | POA: Diagnosis present

## 2016-10-02 DIAGNOSIS — Z79891 Long term (current) use of opiate analgesic: Secondary | ICD-10-CM | POA: Diagnosis not present

## 2016-10-02 DIAGNOSIS — K219 Gastro-esophageal reflux disease without esophagitis: Secondary | ICD-10-CM | POA: Diagnosis present

## 2016-10-02 DIAGNOSIS — I482 Chronic atrial fibrillation: Secondary | ICD-10-CM | POA: Diagnosis not present

## 2016-10-02 DIAGNOSIS — L02212 Cutaneous abscess of back [any part, except buttock]: Secondary | ICD-10-CM

## 2016-10-02 DIAGNOSIS — R627 Adult failure to thrive: Secondary | ICD-10-CM | POA: Diagnosis not present

## 2016-10-02 DIAGNOSIS — Z96652 Presence of left artificial knee joint: Secondary | ICD-10-CM | POA: Diagnosis present

## 2016-10-02 DIAGNOSIS — M6281 Muscle weakness (generalized): Secondary | ICD-10-CM

## 2016-10-02 DIAGNOSIS — R279 Unspecified lack of coordination: Secondary | ICD-10-CM | POA: Diagnosis not present

## 2016-10-02 DIAGNOSIS — Z886 Allergy status to analgesic agent status: Secondary | ICD-10-CM

## 2016-10-02 DIAGNOSIS — Z96643 Presence of artificial hip joint, bilateral: Secondary | ICD-10-CM | POA: Diagnosis not present

## 2016-10-02 DIAGNOSIS — E876 Hypokalemia: Secondary | ICD-10-CM | POA: Diagnosis not present

## 2016-10-02 DIAGNOSIS — Z79899 Other long term (current) drug therapy: Secondary | ICD-10-CM

## 2016-10-02 DIAGNOSIS — Z7189 Other specified counseling: Secondary | ICD-10-CM

## 2016-10-02 DIAGNOSIS — R404 Transient alteration of awareness: Secondary | ICD-10-CM | POA: Diagnosis not present

## 2016-10-02 DIAGNOSIS — Z681 Body mass index (BMI) 19 or less, adult: Secondary | ICD-10-CM

## 2016-10-02 DIAGNOSIS — Z66 Do not resuscitate: Secondary | ICD-10-CM | POA: Diagnosis present

## 2016-10-02 DIAGNOSIS — I10 Essential (primary) hypertension: Secondary | ICD-10-CM | POA: Diagnosis not present

## 2016-10-02 DIAGNOSIS — A419 Sepsis, unspecified organism: Secondary | ICD-10-CM | POA: Diagnosis not present

## 2016-10-02 DIAGNOSIS — Z885 Allergy status to narcotic agent status: Secondary | ICD-10-CM

## 2016-10-02 DIAGNOSIS — F411 Generalized anxiety disorder: Secondary | ICD-10-CM | POA: Diagnosis not present

## 2016-10-02 DIAGNOSIS — Z96642 Presence of left artificial hip joint: Secondary | ICD-10-CM | POA: Diagnosis not present

## 2016-10-02 DIAGNOSIS — I252 Old myocardial infarction: Secondary | ICD-10-CM

## 2016-10-02 DIAGNOSIS — Z96641 Presence of right artificial hip joint: Secondary | ICD-10-CM | POA: Diagnosis not present

## 2016-10-02 DIAGNOSIS — M069 Rheumatoid arthritis, unspecified: Secondary | ICD-10-CM | POA: Diagnosis present

## 2016-10-02 DIAGNOSIS — R531 Weakness: Secondary | ICD-10-CM | POA: Diagnosis not present

## 2016-10-02 DIAGNOSIS — J189 Pneumonia, unspecified organism: Secondary | ICD-10-CM | POA: Diagnosis not present

## 2016-10-02 DIAGNOSIS — Z7982 Long term (current) use of aspirin: Secondary | ICD-10-CM | POA: Diagnosis not present

## 2016-10-02 DIAGNOSIS — M0549 Rheumatoid myopathy with rheumatoid arthritis of multiple sites: Secondary | ICD-10-CM | POA: Diagnosis not present

## 2016-10-02 HISTORY — DX: Pneumonia, unspecified organism: J18.9

## 2016-10-02 LAB — URINALYSIS, ROUTINE W REFLEX MICROSCOPIC
Bilirubin Urine: NEGATIVE
Glucose, UA: NEGATIVE mg/dL
Hgb urine dipstick: NEGATIVE
KETONES UR: NEGATIVE mg/dL
NITRITE: NEGATIVE
PH: 5 (ref 5.0–8.0)
Protein, ur: NEGATIVE mg/dL
Specific Gravity, Urine: 1.015 (ref 1.005–1.030)

## 2016-10-02 LAB — DIFFERENTIAL
BASOS ABS: 0 10*3/uL (ref 0.0–0.1)
BASOS PCT: 0 %
EOS PCT: 0 %
Eosinophils Absolute: 0.1 10*3/uL (ref 0.0–0.7)
LYMPHS PCT: 9 %
Lymphs Abs: 3 10*3/uL (ref 0.7–4.0)
MONOS PCT: 7 %
Monocytes Absolute: 2.3 10*3/uL — ABNORMAL HIGH (ref 0.1–1.0)
Neutro Abs: 29.4 10*3/uL — ABNORMAL HIGH (ref 1.7–7.7)
Neutrophils Relative %: 84 %

## 2016-10-02 LAB — CBC WITH DIFFERENTIAL/PLATELET
BASOS ABS: 0.1 10*3/uL (ref 0.0–0.1)
BASOS PCT: 0 %
EOS PCT: 0 %
Eosinophils Absolute: 0 10*3/uL (ref 0.0–0.7)
HEMATOCRIT: 36.3 % (ref 36.0–46.0)
Hemoglobin: 12.8 g/dL (ref 12.0–15.0)
LYMPHS PCT: 4 %
Lymphs Abs: 1.5 10*3/uL (ref 0.7–4.0)
MCH: 36.4 pg — ABNORMAL HIGH (ref 26.0–34.0)
MCHC: 35.3 g/dL (ref 30.0–36.0)
MCV: 103.1 fL — ABNORMAL HIGH (ref 78.0–100.0)
Monocytes Absolute: 1.6 10*3/uL — ABNORMAL HIGH (ref 0.1–1.0)
Monocytes Relative: 4 %
NEUTROS ABS: 34 10*3/uL — AB (ref 1.7–7.7)
Neutrophils Relative %: 92 %
Platelets: 433 10*3/uL — ABNORMAL HIGH (ref 150–400)
RBC: 3.52 MIL/uL — AB (ref 3.87–5.11)
RDW: 12.9 % (ref 11.5–15.5)
WBC: 36.4 10*3/uL — AB (ref 4.0–10.5)

## 2016-10-02 LAB — I-STAT CG4 LACTIC ACID, ED: Lactic Acid, Venous: 3.19 mmol/L (ref 0.5–1.9)

## 2016-10-02 LAB — HEPATIC FUNCTION PANEL
ALT: 11 U/L — ABNORMAL LOW (ref 14–54)
AST: 22 U/L (ref 15–41)
Albumin: 2.1 g/dL — ABNORMAL LOW (ref 3.5–5.0)
Alkaline Phosphatase: 114 U/L (ref 38–126)
BILIRUBIN DIRECT: 0.2 mg/dL (ref 0.1–0.5)
BILIRUBIN INDIRECT: 0.4 mg/dL (ref 0.3–0.9)
Total Bilirubin: 0.6 mg/dL (ref 0.3–1.2)
Total Protein: 5.7 g/dL — ABNORMAL LOW (ref 6.5–8.1)

## 2016-10-02 LAB — CBC
HCT: 41.2 % (ref 36.0–46.0)
Hemoglobin: 14.4 g/dL (ref 12.0–15.0)
MCH: 36 pg — ABNORMAL HIGH (ref 26.0–34.0)
MCHC: 35 g/dL (ref 30.0–36.0)
MCV: 103 fL — AB (ref 78.0–100.0)
PLATELETS: 554 10*3/uL — AB (ref 150–400)
RBC: 4 MIL/uL (ref 3.87–5.11)
RDW: 12.9 % (ref 11.5–15.5)
WBC: 34.5 10*3/uL — AB (ref 4.0–10.5)

## 2016-10-02 LAB — RAPID URINE DRUG SCREEN, HOSP PERFORMED
Amphetamines: NOT DETECTED
Barbiturates: NOT DETECTED
Benzodiazepines: NOT DETECTED
COCAINE: NOT DETECTED
OPIATES: NOT DETECTED
TETRAHYDROCANNABINOL: NOT DETECTED

## 2016-10-02 LAB — PROCALCITONIN: PROCALCITONIN: 2.53 ng/mL

## 2016-10-02 LAB — LACTIC ACID, PLASMA
Lactic Acid, Venous: 1.7 mmol/L (ref 0.5–1.9)
Lactic Acid, Venous: 1.8 mmol/L (ref 0.5–1.9)

## 2016-10-02 LAB — COMPREHENSIVE METABOLIC PANEL
ALBUMIN: 1.8 g/dL — AB (ref 3.5–5.0)
ALT: 10 U/L — ABNORMAL LOW (ref 14–54)
ANION GAP: 9 (ref 5–15)
AST: 16 U/L (ref 15–41)
Alkaline Phosphatase: 101 U/L (ref 38–126)
BILIRUBIN TOTAL: 0.7 mg/dL (ref 0.3–1.2)
BUN: 11 mg/dL (ref 6–20)
CHLORIDE: 98 mmol/L — AB (ref 101–111)
CO2: 26 mmol/L (ref 22–32)
Calcium: 10.2 mg/dL (ref 8.9–10.3)
Creatinine, Ser: 0.44 mg/dL (ref 0.44–1.00)
GFR calc Af Amer: >60 mL/min (ref >60)
GFR calc non Af Amer: >60 mL/min (ref >60)
GLUCOSE: 131 mg/dL — AB (ref 65–99)
POTASSIUM: 2.7 mmol/L — AB (ref 3.5–5.1)
SODIUM: 133 mmol/L — AB (ref 135–145)
TOTAL PROTEIN: 4.8 g/dL — AB (ref 6.5–8.1)

## 2016-10-02 LAB — BASIC METABOLIC PANEL
Anion gap: 11 (ref 5–15)
BUN: 11 mg/dL (ref 6–20)
CO2: 24 mmol/L (ref 22–32)
CREATININE: 0.45 mg/dL (ref 0.44–1.00)
Calcium: 10.3 mg/dL (ref 8.9–10.3)
Chloride: 98 mmol/L — ABNORMAL LOW (ref 101–111)
GFR calc Af Amer: >60 mL/min (ref >60)
Glucose, Bld: 128 mg/dL — ABNORMAL HIGH (ref 65–99)
Potassium: 2.8 mmol/L — ABNORMAL LOW (ref 3.5–5.1)
SODIUM: 133 mmol/L — AB (ref 135–145)

## 2016-10-02 LAB — PROTIME-INR
INR: 0.9
PROTHROMBIN TIME: 12.1 s (ref 11.4–15.2)

## 2016-10-02 LAB — POC OCCULT BLOOD, ED: Fecal Occult Bld: NEGATIVE

## 2016-10-02 LAB — MAGNESIUM: MAGNESIUM: 1.2 mg/dL — AB (ref 1.7–2.4)

## 2016-10-02 LAB — ETHANOL: Alcohol, Ethyl (B): <5 mg/dL (ref <5)

## 2016-10-02 LAB — CBG MONITORING, ED: GLUCOSE-CAPILLARY: 106 mg/dL — AB (ref 65–99)

## 2016-10-02 LAB — TROPONIN I: Troponin I: <0.03 ng/mL (ref <0.03)

## 2016-10-02 LAB — APTT

## 2016-10-02 MED ORDER — ONDANSETRON HCL 4 MG PO TABS: 4.0000 mg | ORAL_TABLET | Freq: Four times a day (QID) | ORAL | Status: DC | PRN

## 2016-10-02 MED ORDER — FENTANYL CITRATE (PF) 100 MCG/2ML IJ SOLN
50.0000 ug | Freq: Once | INTRAMUSCULAR | Status: AC
  Administered 2016-10-02: 50 ug via INTRAVENOUS
  Filled 2016-10-02: qty 2

## 2016-10-02 MED ORDER — DEXTROSE 5 % IV SOLN
500.0000 mg | INTRAVENOUS | Status: DC
  Administered 2016-10-02: 500 mg via INTRAVENOUS
  Filled 2016-10-02 (×2): qty 500

## 2016-10-02 MED ORDER — DEXTROSE 5 % IV SOLN
1.0000 g | INTRAVENOUS | Status: DC
  Administered 2016-10-03 – 2016-10-07 (×5): 1 g via INTRAVENOUS
  Filled 2016-10-02 (×6): qty 10

## 2016-10-02 MED ORDER — SENNOSIDES-DOCUSATE SODIUM 8.6-50 MG PO TABS: 1.0000 | ORAL_TABLET | Freq: Every evening | ORAL | Status: DC | PRN

## 2016-10-02 MED ORDER — THYROID 30 MG PO TABS
90.0000 mg | ORAL_TABLET | Freq: Every day | ORAL | Status: DC
  Administered 2016-10-03 – 2016-10-07 (×5): 90 mg via ORAL
  Filled 2016-10-02 (×7): qty 3

## 2016-10-02 MED ORDER — CALCIUM CARBONATE-VITAMIN D 500-200 MG-UNIT PO TABS
1.0000 | ORAL_TABLET | Freq: Every day | ORAL | Status: DC
  Administered 2016-10-03 – 2016-10-07 (×3): 1 via ORAL
  Filled 2016-10-02 (×4): qty 1

## 2016-10-02 MED ORDER — VANCOMYCIN HCL IN DEXTROSE 1-5 GM/200ML-% IV SOLN
1000.0000 mg | INTRAVENOUS | Status: DC
  Administered 2016-10-02 – 2016-10-04 (×3): 1000 mg via INTRAVENOUS
  Filled 2016-10-02 (×2): qty 200

## 2016-10-02 MED ORDER — DEXTROSE 5 % IV SOLN
500.0000 mg | INTRAVENOUS | Status: DC
  Administered 2016-10-03 – 2016-10-05 (×3): 500 mg via INTRAVENOUS
  Filled 2016-10-02 (×5): qty 500

## 2016-10-02 MED ORDER — ACETAMINOPHEN 325 MG PO TABS
650.0000 mg | ORAL_TABLET | Freq: Four times a day (QID) | ORAL | Status: DC | PRN
  Administered 2016-10-02 – 2016-10-03 (×3): 650 mg via ORAL
  Filled 2016-10-02 (×3): qty 2

## 2016-10-02 MED ORDER — SODIUM CHLORIDE 0.9 % IV BOLUS (SEPSIS)
500.0000 mL | Freq: Once | INTRAVENOUS | Status: AC
  Administered 2016-10-02: 500 mL via INTRAVENOUS

## 2016-10-02 MED ORDER — ONDANSETRON HCL 4 MG/2ML IJ SOLN
4.0000 mg | Freq: Four times a day (QID) | INTRAMUSCULAR | Status: DC | PRN
Start: 2016-10-02 — End: 2016-10-07
  Administered 2016-10-03: 4 mg via INTRAVENOUS
  Filled 2016-10-02: qty 2

## 2016-10-02 MED ORDER — SODIUM CHLORIDE 0.9 % IV BOLUS (SEPSIS)
1000.0000 mL | Freq: Once | INTRAVENOUS | Status: AC
  Administered 2016-10-02: 1000 mL via INTRAVENOUS

## 2016-10-02 MED ORDER — ASPIRIN EC 81 MG PO TBEC
81.0000 mg | DELAYED_RELEASE_TABLET | Freq: Two times a day (BID) | ORAL | Status: DC
  Administered 2016-10-02 – 2016-10-07 (×10): 81 mg via ORAL
  Filled 2016-10-02 (×10): qty 1

## 2016-10-02 MED ORDER — DEXTROSE 5 % IV SOLN: 1.0000 g | Freq: Once | INTRAVENOUS | Status: DC

## 2016-10-02 MED ORDER — DEXTROSE 5 % IV SOLN
1.0000 g | Freq: Once | INTRAVENOUS | Status: AC
  Administered 2016-10-02: 1 g via INTRAVENOUS
  Filled 2016-10-02: qty 10

## 2016-10-02 MED ORDER — VITAMIN D 1000 UNITS PO TABS
1000.0000 [IU] | ORAL_TABLET | Freq: Every day | ORAL | Status: DC
  Administered 2016-10-03 – 2016-10-07 (×5): 1000 [IU] via ORAL
  Filled 2016-10-02 (×5): qty 1

## 2016-10-02 MED ORDER — LIDOCAINE HCL (PF) 1 % IJ SOLN
5.0000 mL | Freq: Once | INTRAMUSCULAR | Status: AC
  Administered 2016-10-02: 5 mL
  Filled 2016-10-02: qty 5

## 2016-10-02 MED ORDER — SODIUM CHLORIDE 0.9 % IV BOLUS (SEPSIS): 1000.0000 mL | Freq: Once | INTRAVENOUS | Status: DC

## 2016-10-02 MED ORDER — SODIUM CHLORIDE 0.9 % IV BOLUS (SEPSIS)
1000.0000 mL | Freq: Once | INTRAVENOUS | Status: DC
  Administered 2016-10-02: 1000 mL via INTRAVENOUS

## 2016-10-02 MED ORDER — DEXTROSE 5 % IV SOLN: 500.0000 mg | Freq: Once | INTRAVENOUS | Status: DC

## 2016-10-02 MED ORDER — OMEGA-3-ACID ETHYL ESTERS 1 G PO CAPS
1.0000 g | ORAL_CAPSULE | Freq: Three times a day (TID) | ORAL | Status: DC
  Administered 2016-10-02 – 2016-10-07 (×15): 1 g via ORAL
  Filled 2016-10-02 (×16): qty 1

## 2016-10-02 MED ORDER — SODIUM CHLORIDE 0.9 % IV BOLUS (SEPSIS)
250.0000 mL | Freq: Once | INTRAVENOUS | Status: AC
  Administered 2016-10-02: 250 mL via INTRAVENOUS

## 2016-10-02 MED ORDER — ENOXAPARIN SODIUM 30 MG/0.3ML ~~LOC~~ SOLN
30.0000 mg | SUBCUTANEOUS | Status: DC
  Administered 2016-10-02 – 2016-10-04 (×3): 30 mg via SUBCUTANEOUS
  Filled 2016-10-02 (×3): qty 0.3

## 2016-10-02 MED ORDER — ACETAMINOPHEN 650 MG RE SUPP: 650.0000 mg | Freq: Four times a day (QID) | RECTAL | Status: DC | PRN

## 2016-10-02 MED ORDER — FOLIC ACID 1 MG PO TABS
1.0000 mg | ORAL_TABLET | Freq: Every day | ORAL | Status: DC
  Administered 2016-10-03 – 2016-10-07 (×5): 1 mg via ORAL
  Filled 2016-10-02 (×5): qty 1

## 2016-10-02 MED ORDER — POTASSIUM CHLORIDE CRYS ER 20 MEQ PO TBCR
40.0000 meq | EXTENDED_RELEASE_TABLET | ORAL | Status: AC
  Administered 2016-10-02 – 2016-10-03 (×4): 40 meq via ORAL
  Filled 2016-10-02 (×4): qty 2

## 2016-10-02 MED ORDER — FERROUS SULFATE 325 (65 FE) MG PO TABS
325.0000 mg | ORAL_TABLET | Freq: Every day | ORAL | Status: DC
  Administered 2016-10-03 – 2016-10-07 (×5): 325 mg via ORAL
  Filled 2016-10-02 (×5): qty 1

## 2016-10-02 MED ORDER — ADULT MULTIVITAMIN W/MINERALS CH
1.0000 | ORAL_TABLET | Freq: Every day | ORAL | Status: DC
  Administered 2016-10-03 – 2016-10-07 (×5): 1 via ORAL
  Filled 2016-10-02 (×5): qty 1

## 2016-10-02 MED ORDER — THYROID 60 MG PO TABS
90.0000 mg | ORAL_TABLET | Freq: Every day | ORAL | Status: DC
  Filled 2016-10-02 (×2): qty 1

## 2016-10-02 MED ORDER — HYDROCORTISONE NA SUCCINATE PF 100 MG IJ SOLR
50.0000 mg | Freq: Three times a day (TID) | INTRAMUSCULAR | Status: DC
  Administered 2016-10-02 – 2016-10-07 (×15): 50 mg via INTRAVENOUS
  Filled 2016-10-02 (×15): qty 2

## 2016-10-02 MED ORDER — VANCOMYCIN HCL IN DEXTROSE 1-5 GM/200ML-% IV SOLN: 1000.0000 mg | Freq: Once | INTRAVENOUS | Status: DC

## 2016-10-02 NOTE — ED Notes (Signed)
Skin Ulcers noted to left posterior ear at stage 3.  Right foot stage 1. Upper back excoriated area with redness, swelling, and signs of drainage.

## 2016-10-02 NOTE — Progress Notes (Signed)
Pharmacy Antibiotic Note  Alyssa Coleman is a 76 y.o. female admitted on 10/02/2016 with cellulitis, CAP. Pharmacy has been consulted for vancomycin, rocephin and azithromycin dosing.  S/p I&D back abscess.  Plan: Cont rocephin 1 gm q24 Cont azithromycin 500 mg IV q24 hours Vancomycin 1gm IV every 24 hours.  Goal trough 10-15 mcg/mL.  f/u renal function, cultures and clinical course  Height: 5\' 7"  (170.2 cm) Weight: 125 lb (56.7 kg) IBW/kg (Calculated) : 61.6  Temp (24hrs), Avg:98.4 F (36.9 C), Min:97.3 F (36.3 C), Max:99.5 F (37.5 C)   Recent Labs Lab 10/02/16 1104 10/02/16 1114  WBC 34.5*  --   CREATININE 0.45  --   LATICACIDVEN  --  3.19*    Estimated Creatinine Clearance: 56.1 mL/min (by C-G formula based on SCr of 0.45 mg/dL).    Allergies  Allergen Reactions  . Adult Aspirin Ec [Aspirin] Nausea Only  . Codeine Nausea Only  . Hydrocodone Hives    Antimicrobials this admission: vanc 2/8 >>  rocephin 2/8 >> Azithromycin  2/8>>   Thank you for allowing pharmacy to be a part of this patient's care.  4/8 10/02/2016 4:38 PM

## 2016-10-02 NOTE — ED Provider Notes (Signed)
[facility]-EMERGENCY DEPT Provider Note   CSN: [id number] Arrival date & time: 10/02/16  1043  By signing my name below, I, Cynda Acres, attest that this documentation has been prepared under the direction and in the presence of Benjiman Core, MD. Electronically Signed: Cynda Acres, Scribe. 10/02/16. 11:00 AM.   History   Chief Complaint Chief Complaint  Patient presents with  . Weakness    LEVEL 5 CAVEAT DUE TO PAIN   HPI Comments: Alyssa Coleman is a 76 y.o. female with a history of GERD, angina, and HTN,  who presents to the Emergency Department via EMS, complaining of constant weakness that began 3 months ago. Per family patient has not moved form her living room recliner. Family has been attempting to get the patient in the hospital, patient has refused. Patient is yelling in pain, she has many back-sided skin wounds.   The history is provided by the EMS personnel and a relative. No language interpreter was used.    Past Medical History:  Diagnosis Date  . Angina   . Anxiety   . Arthritis   . Blood transfusion   . GERD (gastroesophageal reflux disease)   . Hypertension   . Myocardial infarction   . PONV (postoperative nausea and vomiting)   . Shortness of breath   . Spinal headache     Patient Active Problem List   Diagnosis Date Noted  . 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 CARDIAC CATHETERIZATION    . HAND SURGERY  1980   skin infection  . JOINT REPLACEMENT    . ORIF PERIPROSTHETIC FRACTURE  09/17/2011   Procedure: OPEN REDUCTION INTERNAL FIXATION (ORIF) PERIPROSTHETIC FRACTURE;  Surgeon: Shelda Pal, MD;  Location: [facility];  Service: Orthopedics;  Laterality: Left;  . TOTAL HIP ARTHROPLASTY  Feb 97   left  . TOTAL HIP ARTHROPLASTY  aug 97   right hip  . TOTAL KNEE ARTHROPLASTY  1998   left    OB History    No data available       Home  Medications    Prior to Admission medications   Medication Sig Start Date End Date Taking? Authorizing Provider  ARMOUR THYROID 60 MG tablet Take 1.5 tablets by mouth daily. 08/11/16   Historical Provider, MD  Ascorbic Acid (VITAMIN C) 1000 MG tablet Take 1,000 mg by mouth daily.    Historical Provider, MD  aspirin 81 MG tablet Take 1 tablet (81 mg total) by mouth 2 (two) times daily. 09/19/11   Lanney Gins, PA-C  Calcium Carbonate-Vitamin D (CALTRATE 600+D) 600-400 MG-UNIT per tablet Take 1 tablet by mouth daily.    Historical Provider, MD  cholecalciferol (VITAMIN D) 1000 UNITS tablet Take 1,000 Units by mouth daily.    Historical Provider, MD  clotrimazole-betamethasone (LOTRISONE) cream Apply 1 application topically 2 (two) times daily. 09/02/16   Historical Provider, MD  fish oil-omega-3 fatty acids 1000 MG capsule Take 1 g by mouth 3 (three) times daily.    Historical Provider, MD  folic acid (FOLVITE) 1 MG tablet Take 1 mg by mouth daily.    Historical Provider, MD  lisinopril (PRINIVIL,ZESTRIL) 5 MG tablet Take 5 mg by mouth daily.    Historical Provider, MD  Multiple Vitamin (MULITIVITAMIN WITH MINERALS) TABS Take 1 tablet by mouth daily.    Historical Provider, MD  omeprazole (PRILOSEC) 20 MG capsule Take 20 mg by mouth daily as  needed.     Historical Provider, MD  predniSONE (DELTASONE) 5 MG tablet Take 10 mg by mouth daily. 02/23/13   Historical Provider, MD  traMADol (ULTRAM) 50 MG tablet Take 1 tablet (50 mg total) by mouth every 6 (six) hours as needed for pain. 04/19/13   Dione Booze, MD    Family History No family history on file.  Social History Social History  Substance Use Topics  . Smoking status: Never Smoker  . Smokeless tobacco: Never Used  . Alcohol use No     Allergies   Adult aspirin ec [aspirin]; Codeine; and Hydrocodone   Review of Systems Review of Systems  Reason unable to perform ROS: Pain      Physical Exam Updated Vital Signs BP 132/71    Pulse 89   Temp 99.5 F (37.5 C) (Rectal)   Resp 21   Ht 5\' 7"  (1.702 m)   Wt 125 lb (56.7 kg)   SpO2 97%   BMI 19.58 kg/m   Physical Exam  Constitutional: She appears well-developed.  HENT:  Some  skin breakdown on lower fold of left neck. Also has pressure ulcer on left ear after pressure of ear held against left shoulder chronically.  Eyes: Pupils are equal, round, and reactive to light.  Neck:  Neck chronically deviated to left.  Cardiovascular:  Tachycardia  Pulmonary/Chest:  Some tachypnea. No clear rales or rhonchi.  Abdominal: Soft. There is no tenderness.  Musculoskeletal:  Superficial pressure ulcer to decubitus area. Some skin changes. Some mottling of bilateral lower extremities. Mid back between the scapula has an approximately 10 cm x 7 cm fluctuant somewhat erythematous mass. There is a 2 cm scab on it.  Neurological: She is alert.  Patient is yelling in pain with any movement. Somewhat difficult to get history from. Does appear awake and appropriate but cannot provide much history.  Skin: Skin is warm.  Good capillary refill in bilateral hands, however does have delayed capillary refill in both feet. Tenderness to bilateral wrists with movement.      INCISION AND DRAINAGE Performed by: Billee Cashing. Consent: Verbal consent obtained. Risks and benefits: risks, benefits and alternatives were discussed Type: abscess  Body area: back  Anesthesia: local infiltration  Incision was made with a scalpel.  Local anesthetic: lidocaine 1%  Anesthetic total: 2 ml  Complexity: complex Blunt dissection to break up loculations  Drainage: purulent  Drainage amount: large  Packing material: none  Patient tolerance: Patient tolerated the procedure well with no immediate complications.    ED Treatments / Results  DIAGNOSTIC STUDIES: Oxygen Saturation is 97% on RA, normal by my interpretation.    COORDINATION OF CARE: 10:59 AM Discussed treatment  plan with family at bedside and family agreed to plan.  Labs (all labs ordered are listed, but only abnormal results are displayed) Labs Reviewed  BASIC METABOLIC PANEL - Abnormal; Notable for the following:       Result Value   Sodium 133 (*)    Potassium 2.8 (*)    Chloride 98 (*)    Glucose, Bld 128 (*)    All other components within normal limits  CBC - Abnormal; Notable for the following:    WBC 34.5 (*)    MCV 103.0 (*)    MCH 36.0 (*)    Platelets 554 (*)    All other components within normal limits  URINALYSIS, ROUTINE W REFLEX MICROSCOPIC - Abnormal; Notable for the following:    APPearance CLOUDY (*)  Leukocytes, UA LARGE (*)    Bacteria, UA RARE (*)    Non Squamous Epithelial 0-5 (*)    All other components within normal limits  HEPATIC FUNCTION PANEL - Abnormal; Notable for the following:    Total Protein 5.7 (*)    Albumin 2.1 (*)    ALT 11 (*)    All other components within normal limits  DIFFERENTIAL - Abnormal; Notable for the following:    Neutro Abs 29.4 (*)    Monocytes Absolute 2.3 (*)    All other components within normal limits  CBG MONITORING, ED - Abnormal; Notable for the following:    Glucose-Capillary 106 (*)    All other components within normal limits  I-STAT CG4 LACTIC ACID, ED - Abnormal; Notable for the following:    Lactic Acid, Venous 3.19 (*)    All other components within normal limits  CULTURE, BLOOD (ROUTINE X 2)  CULTURE, BLOOD (ROUTINE X 2)  AEROBIC CULTURE (SUPERFICIAL SPECIMEN)  TROPONIN I  RAPID URINE DRUG SCREEN, HOSP PERFORMED  ETHANOL  POC OCCULT BLOOD, ED    EKG  EKG Interpretation  Date/Time:  Thursday October 02 2016 10:52:29 EST Ventricular Rate:  125 PR Interval:    QRS Duration: 82 QT Interval:  277 QTC Calculation: 400 R Axis:   5 Text Interpretation:  Sinus tachycardia Probable anterior infarct, age indeterminate Lateral leads are also involved Confirmed by Rubin Payor  MD, Askia Hazelip [phone]) on 10/02/2016  11:16:47 AM       Radiology Dg Chest Portable 1 View  Result Date: 10/02/2016 CLINICAL DATA:  Weakness. EXAM: PORTABLE CHEST 1 VIEW COMPARISON:  12/21/2004 FINDINGS: Bibasilar opacities, likely atelectasis at the right base. Left basilar opacity could reflect atelectasis or infiltrate. Heart is borderline in size. No effusions or acute bony abnormality. Old healed proximal left humeral fracture. IMPRESSION: Bibasilar atelectasis or infiltrates, left greater than right. Electronically Signed   By: Charlett Nose M.D.   On: 10/02/2016 11:29    Procedures Procedures (including critical care time)  Medications Ordered in ED Medications  sodium chloride 0.9 % bolus 1,000 mL (1,000 mLs Intravenous New Bag/Given 10/02/16 1148)  azithromycin (ZITHROMAX) 500 mg in dextrose 5 % 250 mL IVPB (500 mg Intravenous New Bag/Given 10/02/16 1234)  sodium chloride 0.9 % bolus 1,000 mL (not administered)  lidocaine (PF) (XYLOCAINE) 1 % injection 5 mL (not administered)  cefTRIAXone (ROCEPHIN) 1 g in dextrose 5 % 50 mL IVPB (0 g Intravenous Stopped 10/02/16 1230)  fentaNYL (SUBLIMAZE) injection 50 mcg (50 mcg Intravenous Given 10/02/16 1210)     Initial Impression / Assessment and Plan / ED Course  I have reviewed the triage vital signs and the nursing notes.  Pertinent labs & imaging results that were available during my care of the patient were reviewed by me and considered in my medical decision making (see chart for details).     Patient with generalized weakness. Reportedly is unable to get out of bed for the last few weeks. Does have doctors visits from around a month ago. Initially no family here. Does have large fluctuant area in the mid back that was drained and found to be full of pus, cultures been sent. Also questionable UTI on x-ray. Not hypotensive. Lactic acid is somewhat elevated but not above 4. Fluid given. Blood culture sent. Will admit to internal medicine.  Final Clinical Impressions(s) / ED  Diagnoses   Final diagnoses:  Back abscess  Community acquired pneumonia, unspecified laterality  Hypokalemia  New Prescriptions New Prescriptions   No medications on file   I personally performed the services described in this documentation, which was scribed in my presence. The recorded information has been reviewed and is accurate.       Benjiman Core, MD 10/02/16 1235

## 2016-10-02 NOTE — H&P (Addendum)
History and Physical    Alyssa Coleman ZOX:[id number] DOB: [birth year] DOA: 10/02/2016  Referring MD/NP/PA: Benjiman Core, EDP PCP: Pearson Grippe, MD  Patient coming from: Home  Chief Complaint: Back pain at site of abscess, weakness and deconditioning  HPI: Alyssa Coleman is a 76 y.o. female with past medical history significant for GERD, rheumatoid arthritis on chronic prednisone who presents to the hospital today with the above complaints. Patient states that she has become increasingly weak and deconditioned over the past 3 months. Over the past 4-5 days she has been complaining of increased pain of her back. Upon presentation to the ED she was noticed to have a softball size abscess to her upper back between her scapulas, she was also noted to have signs of sepsis with a marginal blood pressure, tachycardia and elevated lactic acid as well as a WBC count of 34.5. EDP performed incision and drainage of the abscess in the ED yielding approximately 60 mL of purulent material which has been sent for culture. Chest x-ray also shows findings significant for pneumonia. Her head is tilted towards the left, this has caused bruising of her left earlobe. Admission requested.  Past Medical/Surgical History: Past Medical History:  Diagnosis Date  . Angina   . Anxiety   . Arthritis   . Blood transfusion   . CAP (community acquired pneumonia) 10/02/2016  . GERD (gastroesophageal reflux disease)   . Hypertension   . Myocardial infarction   . PONV (postoperative nausea and vomiting)   . Shortness of breath   . Spinal headache     Past Surgical History:  Procedure Laterality Date  . CARDIAC CATHETERIZATION    . HAND SURGERY  1980   skin infection  . JOINT REPLACEMENT    . ORIF PERIPROSTHETIC FRACTURE  09/17/2011   Procedure: OPEN REDUCTION INTERNAL FIXATION (ORIF) PERIPROSTHETIC FRACTURE;  Surgeon: Shelda Pal, MD;  Location: [facility];  Service: Orthopedics;  Laterality: Left;  . TOTAL HIP  ARTHROPLASTY  Feb 97   left  . TOTAL HIP ARTHROPLASTY  aug 97   right hip  . TOTAL KNEE ARTHROPLASTY  1998   left    Social History:  reports that she has never smoked. She has never used smokeless tobacco. She reports that she does not drink alcohol or use drugs.  Allergies: Allergies  Allergen Reactions  . Adult Aspirin Ec [Aspirin] Nausea Only  . Codeine Nausea Only  . Hydrocodone Hives    Family History:  Unable to obtain at this time, husband is unaware of family history and patient is in too much pain to respond   Prior to Admission medications   Medication Sig Start Date End Date Taking? Authorizing Provider  acetaminophen (TYLENOL) 500 MG tablet Take 500 mg by mouth every 6 (six) hours as needed.   Yes Historical Provider, MD  acidophilus (RISAQUAD) CAPS capsule Take 1 capsule by mouth daily.   Yes Historical Provider, MD  ARMOUR THYROID 60 MG tablet Take 1.5 tablets by mouth daily. 08/11/16  Yes Historical Provider, MD  aspirin 81 MG tablet Take 1 tablet (81 mg total) by mouth 2 (two) times daily. 09/19/11  Yes Lanney Gins, PA-C  Calcium Carbonate-Vitamin D (CALTRATE 600+D) 600-400 MG-UNIT per tablet Take 1 tablet by mouth daily.   Yes Historical Provider, MD  cholecalciferol (VITAMIN D) 1000 UNITS tablet Take 1,000 Units by mouth daily.   Yes Historical Provider, MD  ferrous sulfate 325 (65 FE) MG EC tablet Take 325 mg by mouth  daily with breakfast.   Yes Historical Provider, MD  fish oil-omega-3 fatty acids 1000 MG capsule Take 1 g by mouth 3 (three) times daily.   Yes Historical Provider, MD  folic acid (FOLVITE) 1 MG tablet Take 1 mg by mouth daily.   Yes Historical Provider, MD  lisinopril (PRINIVIL,ZESTRIL) 5 MG tablet Take 5 mg by mouth daily.   Yes Historical Provider, MD  Multiple Vitamin (MULITIVITAMIN WITH MINERALS) TABS Take 1 tablet by mouth daily.   Yes Historical Provider, MD  predniSONE (DELTASONE) 5 MG tablet Take 10 mg by mouth daily. 02/23/13  Yes  Historical Provider, MD  traMADol (ULTRAM) 50 MG tablet Take 1 tablet (50 mg total) by mouth every 6 (six) hours as needed for pain. 04/19/13  Yes Dione Booze, MD    Review of Systems:  Constitutional: Denies fever, chills, diaphoresis, appetite change and fatigue.  HEENT: Denies photophobia, eye pain, redness, hearing loss, ear pain, congestion, sore throat, rhinorrhea, sneezing, mouth sores, trouble swallowing, neck pain, neck stiffness and tinnitus.   Respiratory: Denies SOB, DOE, cough, chest tightness,  and wheezing.   Cardiovascular: Denies chest pain, palpitations and leg swelling.  Gastrointestinal: Denies nausea, vomiting, abdominal pain, diarrhea, constipation, blood in stool and abdominal distention.  Genitourinary: Denies dysuria, urgency, frequency, hematuria, flank pain and difficulty urinating.  Endocrine: Denies: hot or cold intolerance, sweats, changes in hair or nails, polyuria, polydipsia. Musculoskeletal: Denies myalgias, back pain, joint swelling, arthralgias and gait problem.  Skin: Denies pallor, rash and wound.  Neurological: Denies dizziness, seizures, syncope, weakness, light-headedness, numbness and headaches.  Hematological: Denies adenopathy. Easy bruising, personal or family bleeding history  Psychiatric/Behavioral: Denies suicidal ideation, mood changes, confusion, nervousness, sleep disturbance and agitation    Physical Exam: Vitals:   10/02/16 1300 10/02/16 1400 10/02/16 1500 10/02/16 1630  BP: 104/63 124/62 123/69 (!) 144/87  Pulse: 111 114 113 (!) 117  Resp: (!) 27 18 (!) 28 20  Temp:    97.7 F (36.5 C)  TempSrc:    Oral  SpO2: 97% 97% 96% 99%  Weight:    45.3 kg (99 lb 13.9 oz)  Height:    5\' 7"  (1.702 m)     Constitutional: Elderly, cachectic female with multiple skin tears and bruises Eyes: PERRL, lids and conjunctivae normal ENMT: Mucous membranes are dry. Posterior pharynx clear of any exudate or lesions. Poor dentition. Left earlobe is  bruised and macerated Neck: normal, supple, no masses, no thyromegaly Respiratory: clear to auscultation bilaterally, no wheezing, no crackles. Normal respiratory effort. No accessory muscle use.  Cardiovascular: Regular rate and rhythm, no murmurs / rubs / gallops. No extremity edema. 2+ pedal pulses. No carotid bruits.  Abdomen: no tenderness, no masses palpated. No hepatosplenomegaly. Bowel sounds positive.  Musculoskeletal: no clubbing / cyanosis. No joint deformity upper and lower extremities. Good ROM, no contractures. Normal muscle tone.  Skin: Bruises around the sacral area and upper butt cheeks, no actual pressure ulcers  Neurologic:  mental status is intact   Labs on Admission: I have personally reviewed the following labs and imaging studies  CBC:  Recent Labs Lab 10/02/16 1104  WBC 34.5*  NEUTROABS 29.4*  HGB 14.4  HCT 41.2  MCV 103.0*  PLT 554*   Basic Metabolic Panel:  Recent Labs Lab 10/02/16 1104  NA 133*  K 2.8*  CL 98*  CO2 24  GLUCOSE 128*  BUN 11  CREATININE 0.45  CALCIUM 10.3   GFR: Estimated Creatinine Clearance: 44.8 mL/min (by C-G formula  based on SCr of 0.45 mg/dL). Liver Function Tests:  Recent Labs Lab 10/02/16 1104  AST 22  ALT 11*  ALKPHOS 114  BILITOT 0.6  PROT 5.7*  ALBUMIN 2.1*   No results for input(s): LIPASE, AMYLASE in the last 168 hours. No results for input(s): AMMONIA in the last 168 hours. Coagulation Profile: No results for input(s): INR, PROTIME in the last 168 hours. Cardiac Enzymes:  Recent Labs Lab 10/02/16 1104  TROPONINI <0.03   BNP (last 3 results) No results for input(s): PROBNP in the last 8760 hours. HbA1C: No results for input(s): HGBA1C in the last 72 hours. CBG:  Recent Labs Lab 10/02/16 1111  GLUCAP 106*   Lipid Profile: No results for input(s): CHOL, HDL, LDLCALC, TRIG, CHOLHDL, LDLDIRECT in the last 72 hours. Thyroid Function Tests: No results for input(s): TSH, T4TOTAL, FREET4,  T3FREE, THYROIDAB in the last 72 hours. Anemia Panel: No results for input(s): VITAMINB12, FOLATE, FERRITIN, TIBC, IRON, RETICCTPCT in the last 72 hours. Urine analysis:    Component Value Date/Time   COLORURINE YELLOW 10/02/2016 1129   APPEARANCEUR CLOUDY (A) 10/02/2016 1129   LABSPEC 1.015 10/02/2016 1129   PHURINE 5.0 10/02/2016 1129   GLUCOSEU NEGATIVE 10/02/2016 1129   HGBUR NEGATIVE 10/02/2016 1129   BILIRUBINUR NEGATIVE 10/02/2016 1129   KETONESUR NEGATIVE 10/02/2016 1129   PROTEINUR NEGATIVE 10/02/2016 1129   NITRITE NEGATIVE 10/02/2016 1129   LEUKOCYTESUR LARGE (A) 10/02/2016 1129   Sepsis Labs: @LABRCNTIP (procalcitonin:4,lacticidven:4) ) Recent Results (from the past 240 hour(s))  Culture, blood (routine x 2)     Status: None (Preliminary result)   Collection Time: 10/02/16 11:46 AM  Result Value Ref Range Status   Specimen Description BLOOD RIGHT HAND  Final   Special Requests BOTTLES DRAWN AEROBIC AND ANAEROBIC 6CC  Final   Culture PENDING  Incomplete   Report Status PENDING  Incomplete  Culture, blood (routine x 2)     Status: None (Preliminary result)   Collection Time: 10/02/16 11:58 AM  Result Value Ref Range Status   Specimen Description BLOOD LEFT HAND  Final   Special Requests BOTTLES DRAWN AEROBIC AND ANAEROBIC 6CC  Final   Culture PENDING  Incomplete   Report Status PENDING  Incomplete     Radiological Exams on Admission: Dg Chest Portable 1 View  Result Date: 10/02/2016 CLINICAL DATA:  Weakness. EXAM: PORTABLE CHEST 1 VIEW COMPARISON:  12/21/2004 FINDINGS: Bibasilar opacities, likely atelectasis at the right base. Left basilar opacity could reflect atelectasis or infiltrate. Heart is borderline in size. No effusions or acute bony abnormality. Old healed proximal left humeral fracture. IMPRESSION: Bibasilar atelectasis or infiltrates, left greater than right. Electronically Signed   By: Charlett Nose M.D.   On: 10/02/2016 11:29    EKG: Independently  reviewed. Sinus tachycardia at a rate of 125  Assessment/Plan Principal Problem:   Sepsis (HCC) Active Problems:   CAP (community acquired pneumonia)   Back abscess    Sepsis -Likely due to a combination of her back abscess and her community-acquired pneumonia. -Weight directed fluid boluses have been initiated, we'll repeat lactic acid, monitor blood pressure closely, follow WBC count. -We'll be started on Rocephin and azithromycin for pneumonia and will add vancomycin for her back abscess. -Blood cultures and wound cultures have been requested and will be followed for narrowing of antibiotics. -Since on chronic prednisone will initiate stress dose steroids with intravenous hydrocortisone.   DVT prophylaxis:  Lovenox  Code Status:  DO NOT RESUSCITATE  Family Communication:  husband  at bedside  Disposition Plan:  anticipate discharge to SNF when medically stable  Consults called:  none  Admission status:  inpatient    Time Spent:  80 minutes  Chaya Jan MD [facility] Pager [phone]  If 7PM-7AM, please contact night-coverage [email or web address] Password [email or web address]  10/02/2016, 4:45 PM

## 2016-10-02 NOTE — ED Notes (Signed)
Vascular access team at bedside.

## 2016-10-02 NOTE — ED Triage Notes (Signed)
Pt comes in by EMS from home. Pt has been having weakness for 3 month and has stayed in her living room recliner. Family has attempted to get patient to the hospital but patient has refused. Pt is weak on presentation to the ED. Patient has pain all over with many skin wounds noted. Pt has large wound to upper back.

## 2016-10-02 NOTE — ED Notes (Signed)
[facility] returned call.  Someone should be here in the next hour or two.

## 2016-10-02 NOTE — ED Notes (Signed)
IV access infiltrated. Pt saline and antibiotics stopped. Area wrapped with a compress.

## 2016-10-03 LAB — BASIC METABOLIC PANEL
ANION GAP: 6 (ref 5–15)
BUN: 13 mg/dL (ref 6–20)
CO2: 25 mmol/L (ref 22–32)
Calcium: 10 mg/dL (ref 8.9–10.3)
Chloride: 104 mmol/L (ref 101–111)
Creatinine, Ser: 0.47 mg/dL (ref 0.44–1.00)
Glucose, Bld: 124 mg/dL — ABNORMAL HIGH (ref 65–99)
Potassium: 4.7 mmol/L (ref 3.5–5.1)
SODIUM: 135 mmol/L (ref 135–145)

## 2016-10-03 LAB — CBC
HEMATOCRIT: 34.9 % — AB (ref 36.0–46.0)
Hemoglobin: 12 g/dL (ref 12.0–15.0)
MCH: 35.7 pg — ABNORMAL HIGH (ref 26.0–34.0)
MCHC: 34.4 g/dL (ref 30.0–36.0)
MCV: 103.9 fL — ABNORMAL HIGH (ref 78.0–100.0)
Platelets: 468 10*3/uL — ABNORMAL HIGH (ref 150–400)
RBC: 3.36 MIL/uL — AB (ref 3.87–5.11)
RDW: 13.2 % (ref 11.5–15.5)
WBC: 31.1 10*3/uL — AB (ref 4.0–10.5)

## 2016-10-03 MED ORDER — OXYCODONE HCL 5 MG PO TABS
5.0000 mg | ORAL_TABLET | ORAL | Status: DC | PRN
  Administered 2016-10-03 – 2016-10-07 (×8): 5 mg via ORAL
  Filled 2016-10-03 (×8): qty 1

## 2016-10-03 MED ORDER — OXYCODONE HCL 5 MG PO TABS
10.0000 mg | ORAL_TABLET | Freq: Once | ORAL | Status: AC
  Administered 2016-10-03: 10 mg via ORAL
  Filled 2016-10-03: qty 2

## 2016-10-03 MED ORDER — ENSURE ENLIVE PO LIQD
237.0000 mL | Freq: Three times a day (TID) | ORAL | Status: DC
  Administered 2016-10-03 – 2016-10-07 (×10): 237 mL via ORAL

## 2016-10-03 NOTE — Progress Notes (Signed)
PROGRESS NOTE    Alyssa MARS  Coleman:[id number] DOB: November 21, 1940 DOA: 10/02/2016 PCP: Pearson Grippe, MD     Brief Narrative:  76 year old woman admitted from home on 2/8 with a back abscess, weakness and deconditioning. Abscess was incised and drained in the ED had an initial WBC count of 35,000. Chest x-ray was also significant for pneumonia. Admission was requested for further evaluation and management.   Assessment & Plan:   Principal Problem:   Sepsis (HCC) Active Problems:   CAP (community acquired pneumonia)   Back abscess   Sepsis -Sepsis parameters have improved, lactic acid and blood pressure have normalized, remains with leukocytosis of 31 that is improved from admission.  Back abscess -Status post I and D. -Wound culture pending. -Continue vancomycin.  Community-acquired pneumonia -Continue Rocephin/azithromycin pending culture data.   DVT prophylaxis: Lovenox Code Status: Full code Family Communication: Husband at bedside updated on plan of care and questions answered Disposition Plan: SNF when ready, likely 48-72 hours  Consultants:   None  Procedures:   None  Antimicrobials:  Anti-infectives    Start     Dose/Rate Route Frequency Ordered Stop   10/03/16 1300  cefTRIAXone (ROCEPHIN) 1 g in dextrose 5 % 50 mL IVPB     1 g 100 mL/hr over 30 Minutes Intravenous Every 24 hours 10/02/16 1706     10/03/16 1200  azithromycin (ZITHROMAX) 500 mg in dextrose 5 % 250 mL IVPB     500 mg 250 mL/hr over 60 Minutes Intravenous Every 24 hours 10/02/16 1706     10/02/16 1715  cefTRIAXone (ROCEPHIN) 1 g in dextrose 5 % 50 mL IVPB     1 g 100 mL/hr over 30 Minutes Intravenous  Once 10/02/16 1712     10/02/16 1715  azithromycin (ZITHROMAX) 500 mg in dextrose 5 % 250 mL IVPB     500 mg 250 mL/hr over 60 Minutes Intravenous  Once 10/02/16 1712     10/02/16 1715  vancomycin (VANCOCIN) IVPB 1000 mg/200 mL premix     1,000 mg 200 mL/hr over 60 Minutes Intravenous Every  24 hours 10/02/16 1705     10/02/16 1300  vancomycin (VANCOCIN) IVPB 1000 mg/200 mL premix  Status:  Discontinued     1,000 mg 200 mL/hr over 60 Minutes Intravenous  Once 10/02/16 1256 10/02/16 1701   10/02/16 1145  cefTRIAXone (ROCEPHIN) 1 g in dextrose 5 % 50 mL IVPB     1 g 100 mL/hr over 30 Minutes Intravenous  Once 10/02/16 1137 10/02/16 1230   10/02/16 1145  azithromycin (ZITHROMAX) 500 mg in dextrose 5 % 250 mL IVPB  Status:  Discontinued     500 mg 250 mL/hr over 60 Minutes Intravenous Every 24 hours 10/02/16 1137 10/02/16 1701       Subjective: Lying in bed, makes eye contact, can ask simple questions, head remains tilted towards the left.  Objective: Vitals:   10/02/16 1824 10/02/16 1847 10/02/16 2126 10/03/16 0500  BP: 128/61 (!) 128/55 119/61 121/65  Pulse: 97 95  92  Resp: 18 18 20 20   Temp: 98.2 F (36.8 C) 98.1 F (36.7 C) 97.7 F (36.5 C) 97.9 F (36.6 C)  TempSrc: Oral Oral Oral Oral  SpO2: 98% 98% 99% 100%  Weight:      Height:        Intake/Output Summary (Last 24 hours) at 10/03/16 1406 Last data filed at 10/03/16 1214  Gross per 24 hour  Intake  740 ml  Output                0 ml  Net              740 ml   Filed Weights   10/02/16 1050 10/02/16 1630  Weight: 56.7 kg (125 lb) 45.3 kg (99 lb 13.9 oz)    Examination:  General exam: Alert, awake, oriented x 3 Respiratory system: Clear to auscultation. Respiratory effort normal. Cardiovascular system:RRR. No murmurs, rubs, gallops. Gastrointestinal system: Abdomen is nondistended, soft and nontender. No organomegaly or masses felt. Normal bowel sounds heard. Central nervous system: Alert and oriented. No focal neurological deficits. Extremities: 1+ pitting edema bilaterally Skin: No rashes, lesions or ulcers    Data Reviewed: I have personally reviewed following labs and imaging studies  CBC:  Recent Labs Lab 10/02/16 1104 10/02/16 1735 10/03/16 0414  WBC 34.5* 36.4* 31.1*   NEUTROABS 29.4* 34.0*  --   HGB 14.4 12.8 12.0  HCT 41.2 36.3 34.9*  MCV 103.0* 103.1* 103.9*  PLT 554* 433* 468*   Basic Metabolic Panel:  Recent Labs Lab 10/02/16 1104 10/02/16 1735 10/02/16 2020 10/03/16 0414  NA 133* 133*  --  135  K 2.8* 2.7*  --  4.7  CL 98* 98*  --  104  CO2 24 26  --  25  GLUCOSE 128* 131*  --  124*  BUN 11 11  --  13  CREATININE 0.45 0.44  --  0.47  CALCIUM 10.3 10.2  --  10.0  MG  --   --  1.2*  --    GFR: Estimated Creatinine Clearance: 44.8 mL/min (by C-G formula based on SCr of 0.47 mg/dL). Liver Function Tests:  Recent Labs Lab 10/02/16 1104 10/02/16 1735  AST 22 16  ALT 11* 10*  ALKPHOS 114 101  BILITOT 0.6 0.7  PROT 5.7* 4.8*  ALBUMIN 2.1* 1.8*   No results for input(s): LIPASE, AMYLASE in the last 168 hours. No results for input(s): AMMONIA in the last 168 hours. Coagulation Profile:  Recent Labs Lab 10/02/16 1735  INR 0.90   Cardiac Enzymes:  Recent Labs Lab 10/02/16 1104  TROPONINI <0.03   BNP (last 3 results) No results for input(s): PROBNP in the last 8760 hours. HbA1C: No results for input(s): HGBA1C in the last 72 hours. CBG:  Recent Labs Lab 10/02/16 1111  GLUCAP 106*   Lipid Profile: No results for input(s): CHOL, HDL, LDLCALC, TRIG, CHOLHDL, LDLDIRECT in the last 72 hours. Thyroid Function Tests: No results for input(s): TSH, T4TOTAL, FREET4, T3FREE, THYROIDAB in the last 72 hours. Anemia Panel: No results for input(s): VITAMINB12, FOLATE, FERRITIN, TIBC, IRON, RETICCTPCT in the last 72 hours. Urine analysis:    Component Value Date/Time   COLORURINE YELLOW 10/02/2016 1129   APPEARANCEUR CLOUDY (A) 10/02/2016 1129   LABSPEC 1.015 10/02/2016 1129   PHURINE 5.0 10/02/2016 1129   GLUCOSEU NEGATIVE 10/02/2016 1129   HGBUR NEGATIVE 10/02/2016 1129   BILIRUBINUR NEGATIVE 10/02/2016 1129   KETONESUR NEGATIVE 10/02/2016 1129   PROTEINUR NEGATIVE 10/02/2016 1129   NITRITE NEGATIVE 10/02/2016 1129     LEUKOCYTESUR LARGE (A) 10/02/2016 1129   Sepsis Labs: @LABRCNTIP (procalcitonin:4,lacticidven:4)  ) Recent Results (from the past 240 hour(s))  Culture, blood (routine x 2)     Status: None (Preliminary result)   Collection Time: 10/02/16 11:46 AM  Result Value Ref Range Status   Specimen Description BLOOD RIGHT HAND  Final   Special Requests BOTTLES DRAWN AEROBIC  AND ANAEROBIC 6CC  Final   Culture NO GROWTH < 24 HOURS  Final   Report Status PENDING  Incomplete  Culture, blood (routine x 2)     Status: None (Preliminary result)   Collection Time: 10/02/16 11:58 AM  Result Value Ref Range Status   Specimen Description BLOOD LEFT HAND  Final   Special Requests BOTTLES DRAWN AEROBIC AND ANAEROBIC 6CC  Final   Culture NO GROWTH < 24 HOURS  Final   Report Status PENDING  Incomplete  Wound or Superficial Culture     Status: None (Preliminary result)   Collection Time: 10/02/16 12:30 PM  Result Value Ref Range Status   Specimen Description BACK  Final   Special Requests NONE  Final   Gram Stain   Final    MODERATE WBC PRESENT,BOTH PMN AND MONONUCLEAR FEW GRAM POSITIVE COCCI IN PAIRS Performed at [facility], [address] [place], [place] [address]    Culture PENDING  Incomplete   Report Status PENDING  Incomplete         Radiology Studies: Dg Chest Portable 1 View  Result Date: 10/02/2016 CLINICAL DATA:  Weakness. EXAM: PORTABLE CHEST 1 VIEW COMPARISON:  12/21/2004 FINDINGS: Bibasilar opacities, likely atelectasis at the right base. Left basilar opacity could reflect atelectasis or infiltrate. Heart is borderline in size. No effusions or acute bony abnormality. Old healed proximal left humeral fracture. IMPRESSION: Bibasilar atelectasis or infiltrates, left greater than right. Electronically Signed   By: Charlett Nose M.D.   On: 10/02/2016 11:29        Scheduled Meds: . aspirin EC  81 mg Oral BID  . azithromycin  500 mg Intravenous Once  . azithromycin  500 mg  Intravenous Q24H  . calcium-vitamin D  1 tablet Oral Daily  . cefTRIAXone (ROCEPHIN)  IV  1 g Intravenous Once  . cefTRIAXone (ROCEPHIN)  IV  1 g Intravenous Q24H  . cholecalciferol  1,000 Units Oral Daily  . enoxaparin (LOVENOX) injection  30 mg Subcutaneous Q24H  . ferrous sulfate  325 mg Oral Q breakfast  . folic acid  1 mg Oral Daily  . hydrocortisone sod succinate (SOLU-CORTEF) inj  50 mg Intravenous Q8H  . multivitamin with minerals  1 tablet Oral Daily  . omega-3 acid ethyl esters  1 g Oral TID  . sodium chloride  1,000 mL Intravenous Once  . thyroid  90 mg Oral QAC breakfast  . vancomycin  1,000 mg Intravenous Q24H   Continuous Infusions:   LOS: 1 day    Time spent: 25 minutes. Greater than 50% of this time was spent in direct contact with the patient coordinating care.     Chaya Jan, MD [facility] Pager [phone]  If 7PM-7AM, please contact night-coverage [email or web address] Password [email or web address] 10/03/2016, 2:06 PM

## 2016-10-03 NOTE — Clinical Social Work Note (Signed)
Clinical Social Work Assessment  Patient Details  Name: Alyssa Coleman MRN: [id number] Date of Birth: 11/08/1940  Date of referral:  10/03/16               Reason for consult:  Discharge Planning                Permission sought to share information with:  Family Supports Permission granted to share information::  Yes, Verbal Permission Granted  Name::        Agency::     Relationship::  husband  Contact Information:     Housing/Transportation Living arrangements for the past 2 months:  Single Family Home Source of Information:  Patient, Spouse Patient Interpreter Needed:  None Criminal Activity/Legal Involvement Pertinent to Current Situation/Hospitalization:  No - Comment as needed Significant Relationships:  Spouse Lives with:  Spouse Do you feel safe going back to the place where you live?  Yes Need for family participation in patient care:  Yes (Comment)  Care giving concerns:  Pt is recommended for SNF.    Social Worker assessment / plan:  CSW met with pt and pt's husband at bedside. Pt alert, but deferred most questions to her husband. Pt's husband has been caring for her at home around the clock. She has been in a lift chair. PT evaluated pt and recommend SNF. CSW discussed placement process and insurance authorization. [facility] and he is agreeable to placement there only. If [facility] is not able to offer bed, he plans to take pt home with home health.   Update: Husband aware [facility] can offer bed. Anticipate d/c this weekend per MD. Facility aware. CSW called [employer] to start authorization.   Employment status:  Retired Insurance information:  Managed Medicare PT Recommendations:  Skilled Nursing Facility Information / Referral to community resources:  Skilled Nursing Facility  Patient/Family's Response to care:  Pt's [facility] accepts bed at [facility]. Aware waiting on insurance authorization.   Patient/Family's Understanding of and Emotional  Response to Diagnosis, Current Treatment, and Prognosis:  Pt's husband is waiting to speak to MD about pt and treatment plan.   Emotional Assessment Appearance:  Appears stated age Attitude/Demeanor/Rapport:  Unable to Assess Affect (typically observed):  Unable to Assess Orientation:  Oriented to Self, Oriented to Place Alcohol / Substance use:  Not Applicable Psych involvement (Current and /or in the community):  No (Comment)  Discharge Needs  Concerns to be addressed:  Discharge Planning Concerns Readmission within the last 30 days:  No Current discharge risk:  Physical Impairment Barriers to Discharge:  Continued Medical Work up   Stultz, Kara Shanaberger, LCSW 10/03/2016, 1:24 PM [phone] 

## 2016-10-03 NOTE — Progress Notes (Signed)
Initial Nutrition Assessment    INTERVENTION:  Ensure Enlive po TID, each supplement provides 350 kcal and 20 grams of protein   Regular diet - she will need assistance with feeding -according to husband   NUTRITION DIAGNOSIS:  Inadequate oral intake related to acute PNA and abscess  as evidenced by family diet hx and hospital meal intake 25-50%.     GOAL: Pt to meet >/= 90% of their estimated nutrition needs      MONITOR: Po intake (meals and supplements), labs, skin assessments and wt trends     REASON FOR ASSESSMENT:   Malnutrition Screening Tool    ASSESSMENT:  Ms Rasor has pneumonia and an abscess to her back which has been drained. Her hx includes GERD, Dementia, anemia, DM and Arthritis.  Her husband is her caregiver and provided hx. The patient follows a regular diet at home. She became unable to feed herself about 3-4 months ago and has been in the bed for at least the past 10 weeks.   Her appetite has been poor which is not surprising given her acute infection and abscess. She likes bacon biscuits at breakfast and doesn't drink coffee -prefers tea and/or water. Husband denies any problems with chewing but says she needs sips of liquids with her foods as she eats. He has updated our nutrition services staff about her meal preferences.  Her weight hx is conflicting based on husband reported usual wt of 63 kg. He has not weighed her in a while but denies that she weighs 45 kg.  The last hospital wt is 76 years old.     Recent Labs Lab 10/02/16 1104 10/02/16 1735 10/02/16 2020 10/03/16 0414  NA 133* 133*  --  135  K 2.8* 2.7*  --  4.7  CL 98* 98*  --  104  CO2 24 26  --  25  BUN 11 11  --  13  CREATININE 0.45 0.44  --  0.47  CALCIUM 10.3 10.2  --  10.0  MG  --   --  1.2*  --   GLUCOSE 128* 131*  --  124*   Labs: Albumin 1.8, mag 1.2- WBC's 31.1   NFPE: partial exam -pt unable to participate fully. She has muscle loss but has been in the bed for the past 2-3  months and therefore expected.   Diet Order:  Diet regular Room service appropriate? Yes; Fluid consistency: Thin  Skin:   bruising to right forearm, and incision and drainage to abscess (60 ml) sent for culture  Last BM:  2/8 large (seperate hard lumps)  Height:   Ht Readings from Last 1 Encounters:  10/02/16 5\' 7"  (1.702 m)    Weight:   Wt Readings from Last 1 Encounters:  10/02/16 99 lb 13.9 oz (45.3 kg)    Ideal Body Weight:  61 kg  BMI:  Body mass index is 15.64 kg/m.  Estimated Nutritional Needs:   Kcal:  1400-1600  Protein:  75-80 gr  Fluid:  1.4-1.6 liters daily  EDUCATION NEEDS: none at this time    11/30/16 MS,RD,CSG,LDN Office: Royann Shivers Pager: [phone]

## 2016-10-03 NOTE — Clinical Social Work Placement (Signed)
   CLINICAL SOCIAL WORK PLACEMENT  NOTE  Date:  10/03/2016  Patient Details  Name: Alyssa Coleman MRN: [id number] Date of Birth: [birth year]  Clinical Social Work is seeking post-discharge placement for this patient at the Skilled  Nursing Facility level of care (*CSW will initial, date and re-position this form in  chart as items are completed):  Yes   Patient/family provided with [facility] Clinical Social Work Department's list of facilities offering this level of care within the geographic area requested by the patient (or if unable, by the patient's family).  Yes   Patient/family informed of their freedom to choose among providers that offer the needed level of care, that participate in Medicare, Medicaid or managed care program needed by the patient, have an available bed and are willing to accept the patient.  Yes   Patient/family informed of [facility]'s ownership interest in [facility] and [facility], as well as of the fact that they are under no obligation to receive care at these facilities.  PASRR submitted to EDS on       PASRR number received on       Existing PASRR number confirmed on 10/03/16     FL2 transmitted to all facilities in geographic area requested by pt/family on 10/03/16     FL2 transmitted to all facilities within larger geographic area on       Patient informed that his/her managed care company has contracts with or will negotiate with certain facilities, including the following:        Yes   Patient/family informed of bed offers received.  Patient [facility] bed at [facility]     Physician recommends and patient chooses bed at      Patient to be transferred to [facility] on  .  Patient to be transferred to facility by       Patient family notified on   of transfer.  Name of family member notified:        PHYSICIAN       Additional Comment:    _______________________________________________ Karn Cassis, LCSW 10/03/2016, 1:22 PM [phone]

## 2016-10-03 NOTE — Care Management Important Message (Signed)
Important Message  Patient Details  Name: Alyssa Coleman MRN: [id number] Date of Birth: 09/29/1940   Medicare Important Message Given:  Yes    Malcolm Metro, RN 10/03/2016, 3:24 PM

## 2016-10-03 NOTE — NC FL2 (Signed)
[place] MEDICAID FL2 LEVEL OF CARE SCREENING TOOL     IDENTIFICATION  Patient Name: Alyssa Coleman Birthdate: 09/04/1940 Sex: female Admission Date (Current Location): 10/02/2016  [facility] and [place] Number:  [employer] and Address:  [facility],  [address], [place] [address]      Provider Number: [id number]  Attending Physician Name and Address:  Micael Hampshire Acost*  Relative Name and Phone Number:       Current Level of Care: Hospital Recommended Level of Care: Skilled Nursing Facility Prior Approval Number:    Date Approved/Denied:   PASRR Number: [id number]  Discharge Plan: SNF    Current Diagnoses: Patient Active Problem List   Diagnosis Date Noted  . Sepsis (HCC) 10/02/2016  . CAP (community acquired pneumonia) 10/02/2016  . Back abscess 10/02/2016  . 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 Wound to left upper back. Stage III to posterior ear. Stage I to right foot. )                       Personal Care Assistance Level of Assistance  Bathing, Feeding, Dressing Bathing Assistance: Maximum assistance Feeding assistance: Maximum assistance Dressing Assistance: Maximum assistance     Functional Limitations Info  Sight, Hearing, Speech Sight Info: Impaired Hearing Info: Impaired Speech Info: Adequate    SPECIAL CARE FACTORS FREQUENCY  PT (By licensed PT)     PT Frequency: 5              Contractures      Additional Factors Info  Code Status, Allergies Code Status Info:  DNR Allergies Info: Adult Aspirin EC (Aspirin), Codeine, Hydrocodone           Current Medications (10/03/2016):  This is the current hospital active medication list Current Facility-Administered Medications  Medication Dose Route Frequency Provider Last Rate Last Dose  . acetaminophen (TYLENOL) tablet 650 mg  650 mg Oral Q6H PRN 12/01/2016, MD   650 mg at 10/03/16 12/01/16   Or  . acetaminophen (TYLENOL) suppository 650 mg  650 mg Rectal Q6H PRN 8850, MD      . aspirin EC tablet 81 mg  81 mg Oral BID Henderson Cloud, MD   81 mg at 10/03/16 1023  . azithromycin (ZITHROMAX) 500 mg in dextrose 5 % 250 mL IVPB  500 mg Intravenous Once Estela 06-12-2000, MD      . azithromycin (ZITHROMAX) 500 mg in dextrose 5 % 250 mL IVPB  500 mg Intravenous Q24H Estela Isaiah Blakes, MD      . calcium-vitamin D (OSCAL WITH D) 500-200 MG-UNIT per tablet 1 tablet  1 tablet Oral Daily Estela Isaiah Blakes, MD   1 tablet at 10/03/16 1022  . cefTRIAXone (ROCEPHIN) 1 g in dextrose 5 % 50 mL IVPB  1 g Intravenous Once 12/01/16, MD      . cefTRIAXone (ROCEPHIN) 1 g in dextrose 5 % 50 mL IVPB  1 g Intravenous Q24H Estela Henderson Cloud,  MD      . cholecalciferol (VITAMIN D) tablet 1,000 Units  1,000 Units Oral Daily Estela Isaiah Blakes, MD   1,000 Units at 10/03/16 1024  . enoxaparin (LOVENOX) injection 30 mg  30 mg Subcutaneous Q24H Henderson Cloud, MD   30 mg at 10/02/16 1853  . ferrous sulfate tablet 325 mg  325 mg Oral Q breakfast Henderson Cloud, MD   325 mg at 10/03/16 1022  . folic acid (FOLVITE) tablet 1 mg  1 mg Oral Daily Estela Isaiah Blakes, MD   1 mg at 10/03/16 1032  . hydrocortisone sodium succinate (SOLU-CORTEF) 100 MG injection 50 mg  50 mg Intravenous Q8H Estela Isaiah Blakes, MD   50 mg at 10/03/16 1023  . multivitamin with minerals tablet 1 tablet  1 tablet Oral Daily Henderson Cloud, MD   1 tablet at 10/03/16 1022  . omega-3 acid ethyl esters (LOVAZA) capsule 1 g  1 g Oral TID Henderson Cloud, MD   1 g at 10/03/16 1022  . ondansetron (ZOFRAN) tablet 4 mg  4 mg Oral Q6H PRN Henderson Cloud, MD       Or  . ondansetron [facility]) injection 4 mg  4 mg Intravenous Q6H PRN Henderson Cloud, MD      . oxyCODONE (Oxy IR/ROXICODONE) immediate release tablet 5 mg  5 mg Oral Q4H PRN Roma Kayser Schorr, NP      . senna-docusate (Senokot-S) tablet 1 tablet  1 tablet Oral QHS PRN Henderson Cloud, MD      . sodium chloride 0.9 % bolus 1,000 mL  1,000 mL Intravenous Once Henderson Cloud, MD      . thyroid (ARMOUR) tablet 90 mg  90 mg Oral QAC breakfast Henderson Cloud, MD   90 mg at 10/03/16 1025  . vancomycin (VANCOCIN) IVPB 1000 mg/200 mL premix  1,000 mg Intravenous Q24H Henderson Cloud, MD   1,000 mg at 10/02/16 8022     Discharge Medications: Please see discharge summary for a list of discharge medications.  Relevant Imaging Results:  Relevant Lab Results:   Additional Information SSN: [id number]  Karn Cassis, [place] [phone]

## 2016-10-03 NOTE — Evaluation (Signed)
Physical Therapy Evaluation Patient Details Name: Alyssa Coleman MRN: [id number] DOB: May 14, 1941 Today's Date: 10/03/2016   History of Present Illness  76 y.o. female with past medical history significant for GERD, severe rheumatoid arthritis on chronic prednisone who presents to the hospital today with the above complaints. Patient states that she has become increasingly weak and deconditioned over the past 3 months. Over the past 4-5 days she has been complaining of increased pain of her back. Upon presentation to the ED she was noticed to have a softball size abscess to her upper back between her scapulas, she was also noted to have signs of sepsis with a marginal blood pressure, tachycardia and elevated lactic acid as well as a WBC count of 34.5. EDP performed incision and drainage of the abscess in the ED yielding approximately 60 mL of purulent material which has been sent for culture. Chest x-ray also shows findings significant for pneumonia. Her head is tilted towards the left, this has caused a small pressure ulcer of her left earlobe.    Clinical Impression  Pt received in bed, husband present, and pt is agreeable to PT evaluation.  Husband expressed that pt does not hear well, and he answered most of the PLOF questions.  Husband states that she has not ambulated in at least 3 months, and it has been about 6 weeks since she has been able to transfer out of her lift chair over to her [employer] with assistance.  Her husband assists her with dressing, bathing and feeding due to deformities related to RA.  Pt was able to participate in bed level exercises, and required Max A for transfer supine<>sit, and total A for sit<>supine.  Pt is limited with further mobility due to increased pain in her back. Recommend SNF at this point due to need for increased assistance for all functional mobility.       Follow Up Recommendations SNF    Equipment Recommendations  None recommended by PT    Recommendations  for Other Services OT consult - for positioning and possible w/c assessment.     Precautions / Restrictions Precautions Precautions: Fall Precaution Comments: due to immobilility.   Restrictions Weight Bearing Restrictions: No      Mobility  Bed Mobility Overal bed mobility: Needs Assistance Bed Mobility: Supine to Sit;Sit to Supine     Supine to sit: Max assist;HOB elevated Sit to supine: Total assist   General bed mobility comments: Pt was able to sit on the EOB with Min guard due to inability to flex R knee and get R foot flat on the floor.  Pt was able to stay sitting on the EOB for ~3 minutes, however limited due to increased back pain and c/o dizziness.    Transfers                    Ambulation/Gait                Stairs            Wheelchair Mobility    Modified Rankin (Stroke Patients Only)       Balance Overall balance assessment: Needs assistance Sitting-balance support: No upper extremity supported;Feet supported Sitting balance-Leahy Scale: Fair                                       Pertinent Vitals/Pain Pain Assessment: 0-10 Pain Score: 9  Pain  Location: back Pain Descriptors / Indicators: Throbbing Pain Intervention(s): Limited activity within patient's tolerance;Monitored during session;Repositioned    Home Living   Living Arrangements: Spouse/significant other Available Help at Discharge: Available 24 hours/day Type of Home: House Home Access: Ramped entrance     Home Layout: One level Home Equipment: Bedside commode;Wheelchair - [employer] - 2 wheels (lift chair - that extends all the way back flat.   Pt sleeps in this chair at this point.  )      Prior Function     Gait / Transfers Assistance Needed: Pt has not been ambulatory for 3 months.  Pt has not been able to transfer bed<>BSC for about 6 weeks.  Husband states that she stays in her lift chair, which flattens all the way out, and has had to  use a bed pan for toileting for ~6wks  ADL's / Homemaking Assistance Needed: Husband assists with feeding, dressing and bathing due to RA deformities.           Hand Dominance        Extremity/Trunk Assessment   Upper Extremity Assessment Upper Extremity Assessment: Generalized weakness (B UE deformities due to RW with B elbows flexed and dupentryn contractures.  Husband also reports that she had broked her shoulder a few years ago, but does not remember which one.  )    Lower Extremity Assessment Lower Extremity Assessment: Generalized weakness;RLE deficits/detail;LLE deficits/detail RLE Deficits / Details: Rigid knee - locked in extension and extremely painful.  Grossly 2/5 for strength LLE Deficits / Details: Grossly 2/5 for strength    Cervical / Trunk Assessment Cervical / Trunk Assessment: Other exceptions;Kyphotic Cervical / Trunk Exceptions: Pt has severe RA and cervical spine is contracted in L side bent position with L earlobe touching her shoulder.  Full AROM not tested today due to increased pain.  Communication   Communication: HOH  Cognition Arousal/Alertness: Awake/alert Behavior During Therapy: WFL for tasks assessed/performed Overall Cognitive Status: Impaired/Different from baseline Area of Impairment: Orientation Orientation Level: Time;Disoriented to ("1974")             General Comments: Husband states that many times she just can't hear very well when questions are asked.     General Comments General comments (skin integrity, edema, etc.): Mepilex bandages on B heels, and noted wound with bandages on her back due to abcess that is s/p I&D.  Left posterior ear at stage 3 with bandage covering.  Right foot stage 1.    Exercises General Exercises - Lower Extremity Ankle Circles/Pumps: AROM;Both;5 reps;Supine Heel Slides: AROM;AAROM;Both;5 reps;Supine;Limitations Heel Slides Limitations: R knee only demonstrates about 10* of flexion/extension mobility  and is locked in extension Hip ABduction/ADduction: AAROM;Both;5 reps;Supine   Assessment/Plan    PT Assessment Patient needs continued PT services  PT Problem List Decreased strength;Decreased range of motion;Decreased activity tolerance;Decreased balance;Decreased mobility;Decreased skin integrity;Pain          PT Treatment Interventions DME instruction;Functional mobility training;Therapeutic activities;Therapeutic exercise;Balance training;Patient/family education;Wheelchair mobility training    PT Goals (Current goals can be found in the Care Plan section)  Acute Rehab PT Goals Patient Stated Goal: To move better PT Goal Formulation: With patient/family Time For Goal Achievement: 10/10/16 Potential to Achieve Goals: Fair    Frequency Min 3X/week   Barriers to discharge Decreased caregiver support Lives with husband who was present during PT evaluation - wearing a back brace and expressed that he just had to have injections in his back 2 days ago.  Co-evaluation               End of Session   Activity Tolerance: Patient limited by fatigue;Patient limited by pain Patient left: in bed;with call bell/phone within reach;with family/visitor present Nurse Communication: Mobility status (Mobility sheet left hanging in the room, pt will need maxi move for transfer out of the bed.  )    Functional Assessment Tool Used: [employer] AM-PAC "6-click"  Functional Limitation: Mobility: Walking and moving around Mobility: Walking and Moving Around Current Status [phone]): At least 60 percent but less than 80 percent impaired, limited or restricted Mobility: Walking and Moving Around Goal Status [phone]): At least 40 percent but less than 60 percent impaired, limited or restricted    Time: 0951-1019 PT Time Calculation (min) (ACUTE ONLY): 28 min   Charges:   PT Evaluation $PT Eval Low Complexity: 1 Procedure PT Treatments $Therapeutic Activity: 8-22 mins   PT G  Codes:   PT G-Codes **NOT FOR INPATIENT CLASS** Functional Assessment Tool Used: [employer] "6-click"  Functional Limitation: Mobility: Walking and moving around Mobility: Walking and Moving Around Current Status [phone]): At least 60 percent but less than 80 percent impaired, limited or restricted Mobility: Walking and Moving Around Goal Status [phone]): At least 40 percent but less than 60 percent impaired, limited or restricted   Beth Ashiah Karpowicz, PT, DPT X: [phone]

## 2016-10-04 LAB — CBC
HCT: 34.5 % — ABNORMAL LOW (ref 36.0–46.0)
Hemoglobin: 11.6 g/dL — ABNORMAL LOW (ref 12.0–15.0)
MCH: 35.6 pg — AB (ref 26.0–34.0)
MCHC: 33.6 g/dL (ref 30.0–36.0)
MCV: 105.8 fL — ABNORMAL HIGH (ref 78.0–100.0)
PLATELETS: 450 10*3/uL — AB (ref 150–400)
RBC: 3.26 MIL/uL — AB (ref 3.87–5.11)
RDW: 13.4 % (ref 11.5–15.5)
WBC: 29.5 10*3/uL — ABNORMAL HIGH (ref 4.0–10.5)

## 2016-10-04 MED ORDER — SODIUM CHLORIDE 0.9% FLUSH
10.0000 mL | INTRAVENOUS | Status: DC | PRN
  Administered 2016-10-05: 10 mL
  Filled 2016-10-04: qty 40

## 2016-10-04 MED ORDER — SODIUM CHLORIDE 0.9% FLUSH
10.0000 mL | Freq: Two times a day (BID) | INTRAVENOUS | Status: DC
Start: 2016-10-04 — End: 2016-10-07
  Administered 2016-10-04: 20 mL
  Administered 2016-10-04: 10 mL
  Administered 2016-10-05: 20 mL
  Administered 2016-10-06 – 2016-10-07 (×3): 10 mL

## 2016-10-04 NOTE — Progress Notes (Signed)
PROGRESS NOTE    Alyssa Coleman  NIO:[id number] DOB: 05/13/1941 DOA: 10/02/2016 PCP: Pearson Grippe, MD     Brief Narrative:  76 year old woman admitted from home on 2/8 with a back abscess, weakness and deconditioning. Abscess was incised and drained in the ED had an initial WBC count of 35,000. Chest x-ray was also significant for pneumonia. Admission was requested for further evaluation and management.   Assessment & Plan:   Principal Problem:   Sepsis (HCC) Active Problems:   CAP (community acquired pneumonia)   Back abscess   Sepsis -Sepsis parameters have improved, lactic acid and blood pressure have normalized, remains with leukocytosis of 29.5 that is slightly improved from admission.  Back abscess -Status post I and D. -Wound culture pending. -Continue vancomycin. -Have asked Dr. Lovell Sheehan to see patient in am.  Community-acquired pneumonia -Continue Rocephin/azithromycin pending culture data.   DVT prophylaxis: Lovenox Code Status: Full code Family Communication: Husband at bedside updated on plan of care and questions answered Disposition Plan: SNF when ready, likely 24-48 hours  Consultants:   None  Procedures:   None  Antimicrobials:  Anti-infectives    Start     Dose/Rate Route Frequency Ordered Stop   10/03/16 1300  cefTRIAXone (ROCEPHIN) 1 g in dextrose 5 % 50 mL IVPB     1 g 100 mL/hr over 30 Minutes Intravenous Every 24 hours 10/02/16 1706     10/03/16 1200  azithromycin (ZITHROMAX) 500 mg in dextrose 5 % 250 mL IVPB     500 mg 250 mL/hr over 60 Minutes Intravenous Every 24 hours 10/02/16 1706     10/02/16 1715  cefTRIAXone (ROCEPHIN) 1 g in dextrose 5 % 50 mL IVPB     1 g 100 mL/hr over 30 Minutes Intravenous  Once 10/02/16 1712     10/02/16 1715  azithromycin (ZITHROMAX) 500 mg in dextrose 5 % 250 mL IVPB     500 mg 250 mL/hr over 60 Minutes Intravenous  Once 10/02/16 1712     10/02/16 1715  vancomycin (VANCOCIN) IVPB 1000 mg/200 mL premix      1,000 mg 200 mL/hr over 60 Minutes Intravenous Every 24 hours 10/02/16 1705     10/02/16 1300  vancomycin (VANCOCIN) IVPB 1000 mg/200 mL premix  Status:  Discontinued     1,000 mg 200 mL/hr over 60 Minutes Intravenous  Once 10/02/16 1256 10/02/16 1701   10/02/16 1145  cefTRIAXone (ROCEPHIN) 1 g in dextrose 5 % 50 mL IVPB     1 g 100 mL/hr over 30 Minutes Intravenous  Once 10/02/16 1137 10/02/16 1230   10/02/16 1145  azithromycin (ZITHROMAX) 500 mg in dextrose 5 % 250 mL IVPB  Status:  Discontinued     500 mg 250 mL/hr over 60 Minutes Intravenous Every 24 hours 10/02/16 1137 10/02/16 1701       Subjective: Lying in bed, makes eye contact, can ask simple questions, head remains tilted towards the left.  Objective: Vitals:   10/03/16 1609 10/03/16 2218 10/04/16 0634 10/04/16 1507  BP: (!) 136/56 133/68 (!) 128/59 109/64  Pulse: 85 82 80 90  Resp: 17 18 20 18   Temp: 97.6 F (36.4 C) 98.4 F (36.9 C) 98.3 F (36.8 C) 97.4 F (36.3 C)  TempSrc: Oral Oral Oral Oral  SpO2: 100% 100% 100% 99%  Weight:      Height:        Intake/Output Summary (Last 24 hours) at 10/04/16 1532 Last data filed at 10/04/16 1300  Gross  per 24 hour  Intake             1660 ml  Output                0 ml  Net             1660 ml   Filed Weights   10/02/16 1050 10/02/16 1630  Weight: 56.7 kg (125 lb) 45.3 kg (99 lb 13.9 oz)    Examination:  General exam: Alert, awake, oriented x 3 Respiratory system: Clear to auscultation. Respiratory effort normal. Cardiovascular system:RRR. No murmurs, rubs, gallops. Gastrointestinal system: Abdomen is nondistended, soft and nontender. No organomegaly or masses felt. Normal bowel sounds heard. Central nervous system: Alert and oriented. No focal neurological deficits. Extremities: 1+ pitting edema bilaterally Skin: No rashes, lesions or ulcers    Data Reviewed: I have personally reviewed following labs and imaging studies  CBC:  Recent Labs Lab  10/02/16 1104 10/02/16 1735 10/03/16 0414 10/04/16 0642  WBC 34.5* 36.4* 31.1* 29.5*  NEUTROABS 29.4* 34.0*  --   --   HGB 14.4 12.8 12.0 11.6*  HCT 41.2 36.3 34.9* 34.5*  MCV 103.0* 103.1* 103.9* 105.8*  PLT 554* 433* 468* 450*   Basic Metabolic Panel:  Recent Labs Lab 10/02/16 1104 10/02/16 1735 10/02/16 2020 10/03/16 0414  NA 133* 133*  --  135  K 2.8* 2.7*  --  4.7  CL 98* 98*  --  104  CO2 24 26  --  25  GLUCOSE 128* 131*  --  124*  BUN 11 11  --  13  CREATININE 0.45 0.44  --  0.47  CALCIUM 10.3 10.2  --  10.0  MG  --   --  1.2*  --    GFR: Estimated Creatinine Clearance: 44.8 mL/min (by C-G formula based on SCr of 0.47 mg/dL). Liver Function Tests:  Recent Labs Lab 10/02/16 1104 10/02/16 1735  AST 22 16  ALT 11* 10*  ALKPHOS 114 101  BILITOT 0.6 0.7  PROT 5.7* 4.8*  ALBUMIN 2.1* 1.8*   No results for input(s): LIPASE, AMYLASE in the last 168 hours. No results for input(s): AMMONIA in the last 168 hours. Coagulation Profile:  Recent Labs Lab 10/02/16 1735  INR 0.90   Cardiac Enzymes:  Recent Labs Lab 10/02/16 1104  TROPONINI <0.03   BNP (last 3 results) No results for input(s): PROBNP in the last 8760 hours. HbA1C: No results for input(s): HGBA1C in the last 72 hours. CBG:  Recent Labs Lab 10/02/16 1111  GLUCAP 106*   Lipid Profile: No results for input(s): CHOL, HDL, LDLCALC, TRIG, CHOLHDL, LDLDIRECT in the last 72 hours. Thyroid Function Tests: No results for input(s): TSH, T4TOTAL, FREET4, T3FREE, THYROIDAB in the last 72 hours. Anemia Panel: No results for input(s): VITAMINB12, FOLATE, FERRITIN, TIBC, IRON, RETICCTPCT in the last 72 hours. Urine analysis:    Component Value Date/Time   COLORURINE YELLOW 10/02/2016 1129   APPEARANCEUR CLOUDY (A) 10/02/2016 1129   LABSPEC 1.015 10/02/2016 1129   PHURINE 5.0 10/02/2016 1129   GLUCOSEU NEGATIVE 10/02/2016 1129   HGBUR NEGATIVE 10/02/2016 1129   BILIRUBINUR NEGATIVE 10/02/2016  1129   KETONESUR NEGATIVE 10/02/2016 1129   PROTEINUR NEGATIVE 10/02/2016 1129   NITRITE NEGATIVE 10/02/2016 1129   LEUKOCYTESUR LARGE (A) 10/02/2016 1129   Sepsis Labs: @LABRCNTIP (procalcitonin:4,lacticidven:4)  ) Recent Results (from the past 240 hour(s))  Culture, blood (routine x 2)     Status: None (Preliminary result)   Collection Time:  10/02/16 11:46 AM  Result Value Ref Range Status   Specimen Description BLOOD RIGHT HAND  Final   Special Requests BOTTLES DRAWN AEROBIC AND ANAEROBIC 6CC  Final   Culture NO GROWTH < 24 HOURS  Final   Report Status PENDING  Incomplete  Culture, blood (routine x 2)     Status: None (Preliminary result)   Collection Time: 10/02/16 11:58 AM  Result Value Ref Range Status   Specimen Description BLOOD LEFT HAND  Final   Special Requests BOTTLES DRAWN AEROBIC AND ANAEROBIC 6CC  Final   Culture NO GROWTH < 24 HOURS  Final   Report Status PENDING  Incomplete  Wound or Superficial Culture     Status: None (Preliminary result)   Collection Time: 10/02/16 12:30 PM  Result Value Ref Range Status   Specimen Description BACK  Final   Special Requests NONE  Final   Gram Stain   Final    MODERATE WBC PRESENT,BOTH PMN AND MONONUCLEAR FEW GRAM POSITIVE COCCI IN PAIRS Performed at [facility], [address], [place], [place] [address]    Culture FEW GRAM NEGATIVE RODS  Final   Report Status PENDING  Incomplete         Radiology Studies: No results found.      Scheduled Meds: . aspirin EC  81 mg Oral BID  . azithromycin  500 mg Intravenous Once  . azithromycin  500 mg Intravenous Q24H  . calcium-vitamin D  1 tablet Oral Daily  . cefTRIAXone (ROCEPHIN)  IV  1 g Intravenous Once  . cefTRIAXone (ROCEPHIN)  IV  1 g Intravenous Q24H  . cholecalciferol  1,000 Units Oral Daily  . enoxaparin (LOVENOX) injection  30 mg Subcutaneous Q24H  . feeding supplement (ENSURE ENLIVE)  237 mL Oral TID BM  . ferrous sulfate  325 mg Oral Q breakfast    . folic acid  1 mg Oral Daily  . hydrocortisone sod succinate (SOLU-CORTEF) inj  50 mg Intravenous Q8H  . multivitamin with minerals  1 tablet Oral Daily  . omega-3 acid ethyl esters  1 g Oral TID  . sodium chloride  1,000 mL Intravenous Once  . sodium chloride flush  10-40 mL Intracatheter Q12H  . thyroid  90 mg Oral QAC breakfast  . vancomycin  1,000 mg Intravenous Q24H   Continuous Infusions:   LOS: 2 days    Time spent: 25 minutes. Greater than 50% of this time was spent in direct contact with the patient coordinating care.     Chaya Jan, MD [facility] Pager [phone]  If 7PM-7AM, please contact night-coverage [email or web address] Password [facility] 10/04/2016, 3:32 PM

## 2016-10-05 LAB — CBC
HCT: 34.9 % — ABNORMAL LOW (ref 36.0–46.0)
HEMOGLOBIN: 11.7 g/dL — AB (ref 12.0–15.0)
MCH: 35.9 pg — ABNORMAL HIGH (ref 26.0–34.0)
MCHC: 33.5 g/dL (ref 30.0–36.0)
MCV: 107.1 fL — AB (ref 78.0–100.0)
PLATELETS: 425 10*3/uL — AB (ref 150–400)
RBC: 3.26 MIL/uL — AB (ref 3.87–5.11)
RDW: 13.5 % (ref 11.5–15.5)
WBC: 29.1 10*3/uL — AB (ref 4.0–10.5)

## 2016-10-05 LAB — BASIC METABOLIC PANEL
ANION GAP: 6 (ref 5–15)
BUN: 18 mg/dL (ref 6–20)
CHLORIDE: 103 mmol/L (ref 101–111)
CO2: 28 mmol/L (ref 22–32)
CREATININE: 0.41 mg/dL — AB (ref 0.44–1.00)
Calcium: 10 mg/dL (ref 8.9–10.3)
GFR calc non Af Amer: >60 mL/min (ref >60)
Glucose, Bld: 100 mg/dL — ABNORMAL HIGH (ref 65–99)
Potassium: 4.3 mmol/L (ref 3.5–5.1)
SODIUM: 137 mmol/L (ref 135–145)

## 2016-10-05 LAB — AEROBIC CULTURE  (SUPERFICIAL SPECIMEN)

## 2016-10-05 LAB — AEROBIC CULTURE W GRAM STAIN (SUPERFICIAL SPECIMEN)

## 2016-10-05 LAB — VANCOMYCIN, TROUGH: VANCOMYCIN TR: 17 ug/mL (ref 15–20)

## 2016-10-05 MED ORDER — VANCOMYCIN HCL IN DEXTROSE 750-5 MG/150ML-% IV SOLN
750.0000 mg | INTRAVENOUS | Status: DC
  Administered 2016-10-05 – 2016-10-06 (×2): 750 mg via INTRAVENOUS
  Filled 2016-10-05 (×3): qty 150

## 2016-10-05 MED ORDER — ENOXAPARIN SODIUM 30 MG/0.3ML ~~LOC~~ SOLN
30.0000 mg | SUBCUTANEOUS | Status: DC
  Administered 2016-10-05 – 2016-10-06 (×2): 30 mg via SUBCUTANEOUS
  Filled 2016-10-05 (×2): qty 0.3

## 2016-10-05 MED ORDER — VANCOMYCIN HCL IN DEXTROSE 750-5 MG/150ML-% IV SOLN
INTRAVENOUS | Status: AC
  Filled 2016-10-05: qty 150

## 2016-10-05 NOTE — Progress Notes (Signed)
Pharmacy Antibiotic Note  Alyssa Coleman is a 76 y.o. female admitted on 10/02/2016 with cellulitis, CAP. Pharmacy has been consulted for vancomycin, rocephin and azithromycin dosing.  S/p I&D back abscess. Vancomycin trough 68mcg/ml, goal is 10-68mcg/ml for cellulitis. Patient is improving.  Plan: Cont rocephin 1 gm q24 Cont azithromycin 500 mg IV q24 hours, transition to po as indicated Decrease Vancomycin 750mg  IV every 24 hours.  Goal trough 10-15 mcg/mL. f/u renal function, cultures and clinical course  Height: 5\' 7"  (170.2 cm) Weight: 99 lb 13.9 oz (45.3 kg) IBW/kg (Calculated) : 61.6  Temp (24hrs), Avg:98.4 F (36.9 C), Min:98.2 F (36.8 C), Max:98.6 F (37 C)   Recent Labs Lab 10/02/16 1104 10/02/16 1114 10/02/16 1735 10/02/16 1736 10/02/16 2020 10/03/16 0414 10/04/16 0642 10/05/16 0633 10/05/16 1700  WBC 34.5*  --  36.4*  --   --  31.1* 29.5* 29.1*  --   CREATININE 0.45  --  0.44  --   --  0.47  --  0.41*  --   LATICACIDVEN  --  3.19*  --  1.7 1.8  --   --   --   --   VANCOTROUGH  --   --   --   --   --   --   --   --  17    Estimated Creatinine Clearance: 44.8 mL/min (by C-G formula based on SCr of 0.41 mg/dL (L)).    Allergies  Allergen Reactions  . Adult Aspirin Ec [Aspirin] Nausea Only  . Codeine Nausea Only  . Hydrocodone Hives    Antimicrobials this admission: vanc 2/8 >>  rocephin 2/8 >> Azithromycin  2/8>>  Adjustments in dosing: 2/11 Decrease Vancomycin to 750mg  IV q24h  Microbiology: 2/8 Blood cx: ngtd 2/8 Wound cx +GPC, and  proteus mirabilis(GN)   Thank you for allowing pharmacy to be a part of this patient's care. , BS 4/8, 4/8 Clinical Pharmacist Pager [phone] 10/05/2016 5:38 PM

## 2016-10-05 NOTE — Consult Note (Signed)
Reason for Consult: Abscess, back Referring Physician: Dr. Manley Mason Alyssa Coleman is an 76 y.o. female.  HPI: Patient is a 76 year old white female with multiple medical problems who presented to the emergency room with a back abscess and failure to thrive. She was also noted to have a pneumonia on chest x-ray. Incision and drainage of the abscess was performed in the emergency room and I have been asked to see the patient for ongoing wound care. Patient is somewhat contracted and lying in the bed. No significant history could be obtained from the patient.  Past Medical History:  Diagnosis Date  . Angina   . Anxiety   . Arthritis   . Blood transfusion   . CAP (community acquired pneumonia) 10/02/2016  . GERD (gastroesophageal reflux disease)   . Hypertension   . Myocardial infarction   . PONV (postoperative nausea and vomiting)   . Shortness of breath   . Spinal headache     Past Surgical History:  Procedure Laterality Date  . CARDIAC CATHETERIZATION    . HAND SURGERY  1980   skin infection  . JOINT REPLACEMENT    . ORIF PERIPROSTHETIC FRACTURE  09/17/2011   Procedure: OPEN REDUCTION INTERNAL FIXATION (ORIF) PERIPROSTHETIC FRACTURE;  Surgeon: Mauri Pole, MD;  Location: [facility];  Service: Orthopedics;  Laterality: Left;  . TOTAL HIP ARTHROPLASTY  Feb 97   left  . TOTAL HIP ARTHROPLASTY  aug 97   right hip  . TOTAL KNEE ARTHROPLASTY  1998   left    History reviewed. No pertinent family history.  Social History:  reports that she has never smoked. She has never used smokeless tobacco. She reports that she does not drink alcohol or use drugs.  Allergies:  Allergies  Allergen Reactions  . Adult Aspirin Ec [Aspirin] Nausea Only  . Codeine Nausea Only  . Hydrocodone Hives    Medications:  Prior to Admission:  Prescriptions Prior to Admission  Medication Sig Dispense Refill Last Dose  . acetaminophen (TYLENOL) 500 MG tablet Take 500 mg by mouth every 6 (six) hours as  needed.   10/02/2016 at Unknown time  . acidophilus (RISAQUAD) CAPS capsule Take 1 capsule by mouth daily.   10/02/2016 at Unknown time  . ARMOUR THYROID 60 MG tablet Take 1.5 tablets by mouth daily.  0 10/02/2016 at Unknown time  . aspirin 81 MG tablet Take 1 tablet (81 mg total) by mouth 2 (two) times daily. 30 tablet  10/02/2016 at Unknown time  . Calcium Carbonate-Vitamin D (CALTRATE 600+D) 600-400 MG-UNIT per tablet Take 1 tablet by mouth daily.   10/02/2016 at Unknown time  . cholecalciferol (VITAMIN D) 1000 UNITS tablet Take 1,000 Units by mouth daily.   10/02/2016 at Unknown time  . ferrous sulfate 325 (65 FE) MG EC tablet Take 325 mg by mouth daily with breakfast.   10/02/2016 at Unknown time  . fish oil-omega-3 fatty acids 1000 MG capsule Take 1 g by mouth 3 (three) times daily.   10/02/2016 at Unknown time  . folic acid (FOLVITE) 1 MG tablet Take 1 mg by mouth daily.   10/02/2016 at Unknown time  . lisinopril (PRINIVIL,ZESTRIL) 5 MG tablet Take 5 mg by mouth daily.   10/02/2016 at Unknown time  . Multiple Vitamin (MULITIVITAMIN WITH MINERALS) TABS Take 1 tablet by mouth daily.   10/02/2016 at Unknown time  . predniSONE (DELTASONE) 5 MG tablet Take 10 mg by mouth daily.   10/02/2016 at Unknown time  .  traMADol (ULTRAM) 50 MG tablet Take 1 tablet (50 mg total) by mouth every 6 (six) hours as needed for pain. 20 tablet 0 10/01/2016 at Unknown time   Scheduled: . aspirin EC  81 mg Oral BID  . azithromycin  500 mg Intravenous Once  . azithromycin  500 mg Intravenous Q24H  . calcium-vitamin D  1 tablet Oral Daily  . cefTRIAXone (ROCEPHIN)  IV  1 g Intravenous Once  . cefTRIAXone (ROCEPHIN)  IV  1 g Intravenous Q24H  . cholecalciferol  1,000 Units Oral Daily  . enoxaparin (LOVENOX) injection  30 mg Subcutaneous Q24H  . feeding supplement (ENSURE ENLIVE)  237 mL Oral TID BM  . ferrous sulfate  325 mg Oral Q breakfast  . folic acid  1 mg Oral Daily  . hydrocortisone sod succinate (SOLU-CORTEF) inj  50 mg  Intravenous Q8H  . multivitamin with minerals  1 tablet Oral Daily  . omega-3 acid ethyl esters  1 g Oral TID  . sodium chloride  1,000 mL Intravenous Once  . sodium chloride flush  10-40 mL Intracatheter Q12H  . thyroid  90 mg Oral QAC breakfast  . vancomycin  1,000 mg Intravenous Q24H    Results for orders placed or performed during the hospital encounter of 10/02/16 (from the past 48 hour(s))  CBC     Status: Abnormal   Collection Time: 10/04/16  6:42 AM  Result Value Ref Range   WBC 29.5 (H) 4.0 - 10.5 K/uL   RBC 3.26 (L) 3.87 - 5.11 MIL/uL   Hemoglobin 11.6 (L) 12.0 - 15.0 g/dL   HCT 34.5 (L) 36.0 - 46.0 %   MCV 105.8 (H) 78.0 - 100.0 fL   MCH 35.6 (H) 26.0 - 34.0 pg   MCHC 33.6 30.0 - 36.0 g/dL   RDW 13.4 11.5 - 15.5 %   Platelets 450 (H) 150 - 400 K/uL    Comment: SPECIMEN CHECKED FOR CLOTS PLATELET COUNT CONFIRMED BY SMEAR LARGE PLATELETS PRESENT   Basic metabolic panel     Status: Abnormal   Collection Time: 10/05/16  6:33 AM  Result Value Ref Range   Sodium 137 135 - 145 mmol/L   Potassium 4.3 3.5 - 5.1 mmol/L   Chloride 103 101 - 111 mmol/L   CO2 28 22 - 32 mmol/L   Glucose, Bld 100 (H) 65 - 99 mg/dL   BUN 18 6 - 20 mg/dL   Creatinine, Ser 0.41 (L) 0.44 - 1.00 mg/dL   Calcium 10.0 8.9 - 10.3 mg/dL   GFR calc non Af Amer >60 >60 mL/min   GFR calc Af Amer >60 >60 mL/min    Comment: (NOTE) The eGFR has been calculated using the CKD EPI equation. This calculation has not been validated in all clinical situations. eGFR's persistently <60 mL/min signify possible Chronic Kidney Disease.    Anion gap 6 5 - 15  CBC     Status: Abnormal   Collection Time: 10/05/16  6:33 AM  Result Value Ref Range   WBC 29.1 (H) 4.0 - 10.5 K/uL   RBC 3.26 (L) 3.87 - 5.11 MIL/uL   Hemoglobin 11.7 (L) 12.0 - 15.0 g/dL   HCT 34.9 (L) 36.0 - 46.0 %   MCV 107.1 (H) 78.0 - 100.0 fL   MCH 35.9 (H) 26.0 - 34.0 pg   MCHC 33.5 30.0 - 36.0 g/dL   RDW 13.5 11.5 - 15.5 %   Platelets 425  (H) 150 - 400 K/uL    No  results found.  ROS:  Review of systems not obtained due to patient factors.  Blood pressure (!) 134/59, pulse 89, temperature 98.6 F (37 C), temperature source Oral, resp. rate 16, height _0  (1.702 m), weight 45.3 kg (99 lb 13.9 oz), SpO2 98 %. Physical Exam: Pleasant white female in no acute distress. Skin examination reveals a large greater than 6 cm area of skin necrosis in the left upper back with purulent drainage present. I was able to express a significant amount of purulent fluid. Her skin overall is frail and thin. She also has a dressing placed over the sacrum.  Assessment/Plan: Impression: Skin abscess with necrosis, left upper back Sacral decubitus ulcer Pneumonia, overall general deconditioning Plan: Patient is not a surgery candidate. We will get care to see the patient and offer any further suggestions as to how to take care of this large area of skin necrosis from left upper back. Unfortunately are options are limited.  Edmund Holcomb A 10/05/2016, 10:09 AM

## 2016-10-05 NOTE — Progress Notes (Signed)
PROGRESS NOTE    Alyssa Coleman  QGB:[id number] DOB: 1941-02-04 DOA: 10/02/2016 PCP: Pearson Grippe, MD     Brief Narrative:  76 year old woman admitted from home on 2/8 with a back abscess, weakness and deconditioning. Abscess was incised and drained in the ED had an initial WBC count of 35,000. Chest x-ray was also significant for pneumonia. Admission was requested for further evaluation and management.   Assessment & Plan:   Principal Problem:   Sepsis (HCC) Active Problems:   CAP (community acquired pneumonia)   Back abscess   Sepsis -Sepsis parameters have improved, lactic acid and blood pressure have normalized, remains with leukocytosis of 29 that is slightly improved from admission.  Back abscess -Status post I and D. -Wound culture pending. -Continue vancomycin. -Appreciate surgical recommendations. Wound care consulted. Suspect may need formal I and D.  Community-acquired pneumonia -Continue Rocephin/azithromycin pending culture data.   DVT prophylaxis: Lovenox Code Status: Full code Family Communication: Husband at bedside updated on plan of care and questions answered Disposition Plan: SNF when ready, will request palliative care consultation.  Consultants:   None  Procedures:   None  Antimicrobials:  Anti-infectives    Start     Dose/Rate Route Frequency Ordered Stop   10/03/16 1300  cefTRIAXone (ROCEPHIN) 1 g in dextrose 5 % 50 mL IVPB     1 g 100 mL/hr over 30 Minutes Intravenous Every 24 hours 10/02/16 1706     10/03/16 1200  azithromycin (ZITHROMAX) 500 mg in dextrose 5 % 250 mL IVPB     500 mg 250 mL/hr over 60 Minutes Intravenous Every 24 hours 10/02/16 1706     10/02/16 1715  cefTRIAXone (ROCEPHIN) 1 g in dextrose 5 % 50 mL IVPB     1 g 100 mL/hr over 30 Minutes Intravenous  Once 10/02/16 1712     10/02/16 1715  azithromycin (ZITHROMAX) 500 mg in dextrose 5 % 250 mL IVPB     500 mg 250 mL/hr over 60 Minutes Intravenous  Once 10/02/16 1712      10/02/16 1715  vancomycin (VANCOCIN) IVPB 1000 mg/200 mL premix     1,000 mg 200 mL/hr over 60 Minutes Intravenous Every 24 hours 10/02/16 1705     10/02/16 1300  vancomycin (VANCOCIN) IVPB 1000 mg/200 mL premix  Status:  Discontinued     1,000 mg 200 mL/hr over 60 Minutes Intravenous  Once 10/02/16 1256 10/02/16 1701   10/02/16 1145  cefTRIAXone (ROCEPHIN) 1 g in dextrose 5 % 50 mL IVPB     1 g 100 mL/hr over 30 Minutes Intravenous  Once 10/02/16 1137 10/02/16 1230   10/02/16 1145  azithromycin (ZITHROMAX) 500 mg in dextrose 5 % 250 mL IVPB  Status:  Discontinued     500 mg 250 mL/hr over 60 Minutes Intravenous Every 24 hours 10/02/16 1137 10/02/16 1701       Subjective: Lying in bed, makes eye contact, can ask simple questions, head remains tilted towards the left.  Objective: Vitals:   10/04/16 1507 10/04/16 2124 10/05/16 0437 10/05/16 1538  BP: 109/64 125/69 (!) 134/59 (!) 130/57  Pulse: 90 92 89 87  Resp: 18 18 16 16   Temp: 97.4 F (36.3 C) 98.2 F (36.8 C) 98.6 F (37 C) 98.3 F (36.8 C)  TempSrc: Oral Oral Oral Oral  SpO2: 99% 99% 98% 98%  Weight:      Height:        Intake/Output Summary (Last 24 hours) at 10/05/16 1632 Last data  filed at 10/05/16 1500  Gross per 24 hour  Intake              730 ml  Output                1 ml  Net              729 ml   Filed Weights   10/02/16 1050 10/02/16 1630  Weight: 56.7 kg (125 lb) 45.3 kg (99 lb 13.9 oz)    Examination:  General exam: Alert, awake, oriented x 3 Respiratory system: Clear to auscultation. Respiratory effort normal. Cardiovascular system:RRR. No murmurs, rubs, gallops. Gastrointestinal system: Abdomen is nondistended, soft and nontender. No organomegaly or masses felt. Normal bowel sounds heard. Central nervous system: Alert and oriented. No focal neurological deficits. Extremities: 1+ pitting edema bilaterally Skin: No rashes, lesions or ulcers    Data Reviewed: I have personally reviewed  following labs and imaging studies  CBC:  Recent Labs Lab 10/02/16 1104 10/02/16 1735 10/03/16 0414 10/04/16 0642 10/05/16 0633  WBC 34.5* 36.4* 31.1* 29.5* 29.1*  NEUTROABS 29.4* 34.0*  --   --   --   HGB 14.4 12.8 12.0 11.6* 11.7*  HCT 41.2 36.3 34.9* 34.5* 34.9*  MCV 103.0* 103.1* 103.9* 105.8* 107.1*  PLT 554* 433* 468* 450* 425*   Basic Metabolic Panel:  Recent Labs Lab 10/02/16 1104 10/02/16 1735 10/02/16 2020 10/03/16 0414 10/05/16 0633  NA 133* 133*  --  135 137  K 2.8* 2.7*  --  4.7 4.3  CL 98* 98*  --  104 103  CO2 24 26  --  25 28  GLUCOSE 128* 131*  --  124* 100*  BUN 11 11  --  13 18  CREATININE 0.45 0.44  --  0.47 0.41*  CALCIUM 10.3 10.2  --  10.0 10.0  MG  --   --  1.2*  --   --    GFR: Estimated Creatinine Clearance: 44.8 mL/min (by C-G formula based on SCr of 0.41 mg/dL (L)). Liver Function Tests:  Recent Labs Lab 10/02/16 1104 10/02/16 1735  AST 22 16  ALT 11* 10*  ALKPHOS 114 101  BILITOT 0.6 0.7  PROT 5.7* 4.8*  ALBUMIN 2.1* 1.8*   No results for input(s): LIPASE, AMYLASE in the last 168 hours. No results for input(s): AMMONIA in the last 168 hours. Coagulation Profile:  Recent Labs Lab 10/02/16 1735  INR 0.90   Cardiac Enzymes:  Recent Labs Lab 10/02/16 1104  TROPONINI <0.03   BNP (last 3 results) No results for input(s): PROBNP in the last 8760 hours. HbA1C: No results for input(s): HGBA1C in the last 72 hours. CBG:  Recent Labs Lab 10/02/16 1111  GLUCAP 106*   Lipid Profile: No results for input(s): CHOL, HDL, LDLCALC, TRIG, CHOLHDL, LDLDIRECT in the last 72 hours. Thyroid Function Tests: No results for input(s): TSH, T4TOTAL, FREET4, T3FREE, THYROIDAB in the last 72 hours. Anemia Panel: No results for input(s): VITAMINB12, FOLATE, FERRITIN, TIBC, IRON, RETICCTPCT in the last 72 hours. Urine analysis:    Component Value Date/Time   COLORURINE YELLOW 10/02/2016 1129   APPEARANCEUR CLOUDY (A) 10/02/2016  1129   LABSPEC 1.015 10/02/2016 1129   PHURINE 5.0 10/02/2016 1129   GLUCOSEU NEGATIVE 10/02/2016 1129   HGBUR NEGATIVE 10/02/2016 1129   BILIRUBINUR NEGATIVE 10/02/2016 1129   KETONESUR NEGATIVE 10/02/2016 1129   PROTEINUR NEGATIVE 10/02/2016 1129   NITRITE NEGATIVE 10/02/2016 1129   LEUKOCYTESUR LARGE (A) 10/02/2016 1129  Sepsis Labs: @LABRCNTIP (procalcitonin:4,lacticidven:4)  ) Recent Results (from the past 240 hour(s))  Culture, blood (routine x 2)     Status: None (Preliminary result)   Collection Time: 10/02/16 11:46 AM  Result Value Ref Range Status   Specimen Description BLOOD RIGHT HAND  Final   Special Requests BOTTLES DRAWN AEROBIC AND ANAEROBIC 6CC  Final   Culture NO GROWTH < 24 HOURS  Final   Report Status PENDING  Incomplete  Culture, blood (routine x 2)     Status: None (Preliminary result)   Collection Time: 10/02/16 11:58 AM  Result Value Ref Range Status   Specimen Description BLOOD LEFT HAND  Final   Special Requests BOTTLES DRAWN AEROBIC AND ANAEROBIC 6CC  Final   Culture NO GROWTH < 24 HOURS  Final   Report Status PENDING  Incomplete  Wound or Superficial Culture     Status: None   Collection Time: 10/02/16 12:30 PM  Result Value Ref Range Status   Specimen Description BACK  Final   Special Requests NONE  Final   Gram Stain   Final    MODERATE WBC PRESENT,BOTH PMN AND MONONUCLEAR FEW GRAM POSITIVE COCCI IN PAIRS Performed at [facility], [address], [place], [place]    Culture FEW PROTEUS MIRABILIS  Final   Report Status 10/05/2016 FINAL  Final         Radiology Studies: No results found.      Scheduled Meds: . aspirin EC  81 mg Oral BID  . azithromycin  500 mg Intravenous Once  . azithromycin  500 mg Intravenous Q24H  . calcium-vitamin D  1 tablet Oral Daily  . cefTRIAXone (ROCEPHIN)  IV  1 g Intravenous Once  . cefTRIAXone (ROCEPHIN)  IV  1 g Intravenous Q24H  . cholecalciferol  1,000 Units Oral Daily  .  enoxaparin (LOVENOX) injection  30 mg Subcutaneous Q24H  . feeding supplement (ENSURE ENLIVE)  237 mL Oral TID BM  . ferrous sulfate  325 mg Oral Q breakfast  . folic acid  1 mg Oral Daily  . hydrocortisone sod succinate (SOLU-CORTEF) inj  50 mg Intravenous Q8H  . multivitamin with minerals  1 tablet Oral Daily  . omega-3 acid ethyl esters  1 g Oral TID  . sodium chloride  1,000 mL Intravenous Once  . sodium chloride flush  10-40 mL Intracatheter Q12H  . thyroid  90 mg Oral QAC breakfast  . vancomycin  1,000 mg Intravenous Q24H   Continuous Infusions:   LOS: 3 days    Time spent: 25 minutes. Greater than 50% of this time was spent in direct contact with the patient coordinating care.     12/03/2016, MD [facility] Pager [phone]  If 7PM-7AM, please contact night-coverage [email or web address] Password [facility] 10/05/2016, 4:32 PM

## 2016-10-05 NOTE — Consult Note (Signed)
WOC Nurse wound consult note Reason for Consult: Abscess in the mid-thoracic area, full thickness wound with undermining.  Intact sacrum, heels. Patient has been in a recliner chair for the past month and a half. Due to multiple medical problems and her head tilting toward the left, she has not walked in 6 months. The area was drained in ED and also by Lovell Sheehan here on the floor this am. Wound type: Infectious, pressure Pressure Injury POA: Yes Measurement:10cm x 10cm with central defect measuring 5cm x 7cm x 2.5cm. Undermining is noted from 11-4 o'clock measuring 7cm. The intact skin over the defect presents with a deep red/blue hue as the area is only receiving blood flow from collateral vessels. Eschar obscuring part of the aperture measures 3cm x 2.2cm. Wound bed: Non-viable, deep red  Drainage (amount, consistency, odor) serosanguinous on the old dressing.  Odor consistent with autolytically debriding necrotic tissue Periwound: As noted above, with red to light blue hue, warm over ulcerated area Dressing procedure/placement/frequency: I note that surgery has invited the consultation of Wound Care, but I would defer to Surgery for a continuing plan of care as it may exceed the scope of WOC Nursing. It is understood that if surgery enlarges the defect and debrides to viable tissue, we may have a wound that the patient is not able to heal. The goals of care at this time from a WOC perspective are: pressure redistribution, so I have today provided a therapeutic mattress with low air loss feature for comfort and pressure relief. I have also provided pressure redistribution heel boots to prevent pressure injury to her heels. We must obliterate the defect in the thoracic wound, so I have provided Nursing with guidance via the Orders for filling the cavity and undermined areas with saline moistened roll gauze to reduce the risk of retained gauze and to provide a moisture retentive environment. Twice daily  changes are indicated at this time and it is noted that the patient may require premedication prior to wound care procedures. Patient also may not be able consume the calories required for wound healing and it is apparent that her husband cannot manage her turning and repositioning in the recliner chair at home.  While the POC is conservative, I do not see many options for this highly compromised patient. Note:  Family is requesting information about outpatient wound care centers in [place] and [place]. If you agree that she is appropriate for this post acute care, please refer. WOC nursing team will not follow, but will remain available to this patient, the nursing and medical teams.  Please re-consult if needed. Thanks, Ladona Mow, MSN, RN, GNP, [facility]  Pager# [phone]

## 2016-10-05 NOTE — Progress Notes (Signed)
Wound care performed per order.  Dressing clean, dry, intact.  Low air loss mattress in place.

## 2016-10-06 ENCOUNTER — Encounter (HOSPITAL_COMMUNITY): Payer: Self-pay | Admitting: Primary Care

## 2016-10-06 DIAGNOSIS — Z515 Encounter for palliative care: Secondary | ICD-10-CM

## 2016-10-06 DIAGNOSIS — Z7189 Other specified counseling: Secondary | ICD-10-CM

## 2016-10-06 LAB — CBC
HEMATOCRIT: 33.1 % — AB (ref 36.0–46.0)
HEMOGLOBIN: 11.2 g/dL — AB (ref 12.0–15.0)
MCH: 35.8 pg — ABNORMAL HIGH (ref 26.0–34.0)
MCHC: 33.8 g/dL (ref 30.0–36.0)
MCV: 105.8 fL — ABNORMAL HIGH (ref 78.0–100.0)
Platelets: 382 10*3/uL (ref 150–400)
RBC: 3.13 MIL/uL — ABNORMAL LOW (ref 3.87–5.11)
RDW: 13.3 % (ref 11.5–15.5)
WBC: 25.3 10*3/uL — AB (ref 4.0–10.5)

## 2016-10-06 MED ORDER — AZITHROMYCIN 250 MG PO TABS
500.0000 mg | ORAL_TABLET | Freq: Every day | ORAL | Status: AC
Start: 2016-10-06 — End: 2016-10-06
  Administered 2016-10-06: 500 mg via ORAL
  Filled 2016-10-06: qty 2

## 2016-10-06 NOTE — Progress Notes (Signed)
PROGRESS NOTE    Alyssa Coleman  IPJ:[id number] DOB: [birth year] DOA: 10/02/2016 PCP: Pearson Grippe, MD     Brief Narrative:  76 year old woman admitted from home on 2/8 with a back abscess, weakness and deconditioning. Abscess was incised and drained in the ED had an initial WBC count of 35,000. Chest x-ray was also significant for pneumonia. Admission was requested for further evaluation and management.   Assessment & Plan:   Principal Problem:   Sepsis (HCC) Active Problems:   Community acquired pneumonia   Back abscess   Palliative care encounter   Goals of care, counseling/discussion   Sepsis -Sepsis parameters have improved, lactic acid and blood pressure have normalized, remains with leukocytosis of 25 that is slightly improved from admission.  Back abscess -Status post I and D. -Continue vancomycin. -Appreciate surgical recommendations. Wound care consulted. Suspect may need formal I and D.  Community-acquired pneumonia -Continue Rocephin/azithromycin pending culture data. -Will DC azithro after today (has completed 5 days of treatment).   DVT prophylaxis: Lovenox Code Status: Full code Family Communication: Husband and sister at bedside updated on plan of care and questions answered Disposition Plan: SNF when ready, will request palliative care consultation.  Consultants:   None  Procedures:   None  Antimicrobials:  Anti-infectives    Start     Dose/Rate Route Frequency Ordered Stop   10/06/16 1200  azithromycin (ZITHROMAX) tablet 500 mg     500 mg Oral Daily 10/06/16 1055 10/06/16 1238   10/05/16 1800  vancomycin (VANCOCIN) IVPB 750 mg/150 ml premix     750 mg 150 mL/hr over 60 Minutes Intravenous Every 24 hours 10/05/16 1745     10/03/16 1300  cefTRIAXone (ROCEPHIN) 1 g in dextrose 5 % 50 mL IVPB     1 g 100 mL/hr over 30 Minutes Intravenous Every 24 hours 10/02/16 1706     10/03/16 1200  azithromycin (ZITHROMAX) 500 mg in dextrose 5 % 250 mL IVPB   Status:  Discontinued     500 mg 250 mL/hr over 60 Minutes Intravenous Every 24 hours 10/02/16 1706 10/06/16 1055   10/02/16 1715  cefTRIAXone (ROCEPHIN) 1 g in dextrose 5 % 50 mL IVPB     1 g 100 mL/hr over 30 Minutes Intravenous  Once 10/02/16 1712     10/02/16 1715  azithromycin (ZITHROMAX) 500 mg in dextrose 5 % 250 mL IVPB  Status:  Discontinued     500 mg 250 mL/hr over 60 Minutes Intravenous  Once 10/02/16 1712 10/06/16 1055   10/02/16 1715  vancomycin (VANCOCIN) IVPB 1000 mg/200 mL premix  Status:  Discontinued     1,000 mg 200 mL/hr over 60 Minutes Intravenous Every 24 hours 10/02/16 1705 10/05/16 1745   10/02/16 1300  vancomycin (VANCOCIN) IVPB 1000 mg/200 mL premix  Status:  Discontinued     1,000 mg 200 mL/hr over 60 Minutes Intravenous  Once 10/02/16 1256 10/02/16 1701   10/02/16 1145  cefTRIAXone (ROCEPHIN) 1 g in dextrose 5 % 50 mL IVPB     1 g 100 mL/hr over 30 Minutes Intravenous  Once 10/02/16 1137 10/02/16 1230   10/02/16 1145  azithromycin (ZITHROMAX) 500 mg in dextrose 5 % 250 mL IVPB  Status:  Discontinued     500 mg 250 mL/hr over 60 Minutes Intravenous Every 24 hours 10/02/16 1137 10/02/16 1701       Subjective: Lying in bed, makes eye contact, can ask simple questions, head remains tilted towards the left.  Objective: Vitals:  10/05/16 0437 10/05/16 1538 10/05/16 2140 10/06/16 0703  BP: (!) 134/59 (!) 130/57 (!) 174/77 (!) 159/72  Pulse: 89 87 87 85  Resp: 16 16 16 16   Temp: 98.6 F (37 C) 98.3 F (36.8 C) 98.1 F (36.7 C) 98.3 F (36.8 C)  TempSrc: Oral Oral Oral Oral  SpO2: 98% 98% 98% 97%  Weight:      Height:        Intake/Output Summary (Last 24 hours) at 10/06/16 1744 Last data filed at 10/06/16 1553  Gross per 24 hour  Intake              980 ml  Output              304 ml  Net              676 ml   Filed Weights   10/02/16 1050 10/02/16 1630  Weight: 56.7 kg (125 lb) 45.3 kg (99 lb 13.9 oz)    Examination:  General exam:  Alert, awake, oriented x 3 Respiratory system: Clear to auscultation. Respiratory effort normal. Cardiovascular system:RRR. No murmurs, rubs, gallops. Gastrointestinal system: Abdomen is nondistended, soft and nontender. No organomegaly or masses felt. Normal bowel sounds heard. Central nervous system: Alert and oriented. No focal neurological deficits. Extremities: 1+ pitting edema bilaterally Skin: No rashes, lesions or ulcers    Data Reviewed: I have personally reviewed following labs and imaging studies  CBC:  Recent Labs Lab 10/02/16 1104 10/02/16 1735 10/03/16 0414 10/04/16 0642 10/05/16 0633 10/06/16 0401  WBC 34.5* 36.4* 31.1* 29.5* 29.1* 25.3*  NEUTROABS 29.4* 34.0*  --   --   --   --   HGB 14.4 12.8 12.0 11.6* 11.7* 11.2*  HCT 41.2 36.3 34.9* 34.5* 34.9* 33.1*  MCV 103.0* 103.1* 103.9* 105.8* 107.1* 105.8*  PLT 554* 433* 468* 450* 425* 382   Basic Metabolic Panel:  Recent Labs Lab 10/02/16 1104 10/02/16 1735 10/02/16 2020 10/03/16 0414 10/05/16 0633  NA 133* 133*  --  135 137  K 2.8* 2.7*  --  4.7 4.3  CL 98* 98*  --  104 103  CO2 24 26  --  25 28  GLUCOSE 128* 131*  --  124* 100*  BUN 11 11  --  13 18  CREATININE 0.45 0.44  --  0.47 0.41*  CALCIUM 10.3 10.2  --  10.0 10.0  MG  --   --  1.2*  --   --    GFR: Estimated Creatinine Clearance: 44.8 mL/min (by C-G formula based on SCr of 0.41 mg/dL (L)). Liver Function Tests:  Recent Labs Lab 10/02/16 1104 10/02/16 1735  AST 22 16  ALT 11* 10*  ALKPHOS 114 101  BILITOT 0.6 0.7  PROT 5.7* 4.8*  ALBUMIN 2.1* 1.8*   No results for input(s): LIPASE, AMYLASE in the last 168 hours. No results for input(s): AMMONIA in the last 168 hours. Coagulation Profile:  Recent Labs Lab 10/02/16 1735  INR 0.90   Cardiac Enzymes:  Recent Labs Lab 10/02/16 1104  TROPONINI <0.03   BNP (last 3 results) No results for input(s): PROBNP in the last 8760 hours. HbA1C: No results for input(s): HGBA1C in the  last 72 hours. CBG:  Recent Labs Lab 10/02/16 1111  GLUCAP 106*   Lipid Profile: No results for input(s): CHOL, HDL, LDLCALC, TRIG, CHOLHDL, LDLDIRECT in the last 72 hours. Thyroid Function Tests: No results for input(s): TSH, T4TOTAL, FREET4, T3FREE, THYROIDAB in the last 72 hours.  Anemia Panel: No results for input(s): VITAMINB12, FOLATE, FERRITIN, TIBC, IRON, RETICCTPCT in the last 72 hours. Urine analysis:    Component Value Date/Time   COLORURINE YELLOW 10/02/2016 1129   APPEARANCEUR CLOUDY (A) 10/02/2016 1129   LABSPEC 1.015 10/02/2016 1129   PHURINE 5.0 10/02/2016 1129   GLUCOSEU NEGATIVE 10/02/2016 1129   HGBUR NEGATIVE 10/02/2016 1129   BILIRUBINUR NEGATIVE 10/02/2016 1129   KETONESUR NEGATIVE 10/02/2016 1129   PROTEINUR NEGATIVE 10/02/2016 1129   NITRITE NEGATIVE 10/02/2016 1129   LEUKOCYTESUR LARGE (A) 10/02/2016 1129   Sepsis Labs: @LABRCNTIP (procalcitonin:4,lacticidven:4)  ) Recent Results (from the past 240 hour(s))  Culture, blood (routine x 2)     Status: None (Preliminary result)   Collection Time: 10/02/16 11:46 AM  Result Value Ref Range Status   Specimen Description BLOOD RIGHT HAND  Final   Special Requests BOTTLES DRAWN AEROBIC AND ANAEROBIC 6CC  Final   Culture NO GROWTH < 24 HOURS  Final   Report Status PENDING  Incomplete  Culture, blood (routine x 2)     Status: None (Preliminary result)   Collection Time: 10/02/16 11:58 AM  Result Value Ref Range Status   Specimen Description BLOOD LEFT HAND  Final   Special Requests BOTTLES DRAWN AEROBIC AND ANAEROBIC 6CC  Final   Culture NO GROWTH < 24 HOURS  Final   Report Status PENDING  Incomplete  Wound or Superficial Culture     Status: None   Collection Time: 10/02/16 12:30 PM  Result Value Ref Range Status   Specimen Description BACK  Final   Special Requests NONE  Final   Gram Stain   Final    MODERATE WBC PRESENT,BOTH PMN AND MONONUCLEAR FEW GRAM POSITIVE COCCI IN PAIRS Performed at [facility], [address] [place], [place]    Culture FEW PROTEUS MIRABILIS  Final   Report Status 10/05/2016 FINAL  Final         Radiology Studies: No results found.      Scheduled Meds: . aspirin EC  81 mg Oral BID  . calcium-vitamin D  1 tablet Oral Daily  . cefTRIAXone (ROCEPHIN)  IV  1 g Intravenous Once  . cefTRIAXone (ROCEPHIN)  IV  1 g Intravenous Q24H  . cholecalciferol  1,000 Units Oral Daily  . enoxaparin (LOVENOX) injection  30 mg Subcutaneous Q24H  . feeding supplement (ENSURE ENLIVE)  237 mL Oral TID BM  . ferrous sulfate  325 mg Oral Q breakfast  . folic acid  1 mg Oral Daily  . hydrocortisone sod succinate (SOLU-CORTEF) inj  50 mg Intravenous Q8H  . multivitamin with minerals  1 tablet Oral Daily  . omega-3 acid ethyl esters  1 g Oral TID  . sodium chloride  1,000 mL Intravenous Once  . sodium chloride flush  10-40 mL Intracatheter Q12H  . thyroid  90 mg Oral QAC breakfast  . vancomycin  750 mg Intravenous Q24H   Continuous Infusions:   LOS: 4 days    Time spent: 25 minutes. Greater than 50% of this time was spent in direct contact with the patient coordinating care.     12/03/2016, MD [facility] Pager [phone]  If 7PM-7AM, please contact night-coverage [email or web address] Password [facility] 10/06/2016, 5:44 PM

## 2016-10-06 NOTE — Progress Notes (Signed)
Physical Therapy Treatment Patient Details Name: Alyssa Coleman MRN: [id number] DOB: [birth year] Today's Date: 10/06/2016    History of Present Illness 76 y.o. female with past medical history significant for GERD, severe rheumatoid arthritis on chronic prednisone who presents to the hospital today with the above complaints. Patient states that she has become increasingly weak and deconditioned over the past 3 months. Over the past 4-5 days she has been complaining of increased pain of her back. Upon presentation to the ED she was noticed to have a softball size abscess to her upper back between her scapulas, she was also noted to have signs of sepsis with a marginal blood pressure, tachycardia and elevated lactic acid as well as a WBC count of 34.5. EDP performed incision and drainage of the abscess in the ED yielding approximately 60 mL of purulent material which has been sent for culture. Chest x-ray also shows findings significant for pneumonia. Her head is tilted towards the left, this has caused a small pressure ulcer of her left earlobe.    PT Comments    Pt received in bed, husband present and 2 friends also present.  Pt requires max A for supine<>sit.  Due to new air mattress and bariatric bed, she requires Max A to maintain static sitting balance while sitting on the EOB due to slick surface of mattress.  Pt was able to maintain static sitting balance for while performing lateral weight shifting.  She fatigues quickly, and expressed that she is too fatigued to attempt sitting in a chair today.  Will attempt at next visit.    Follow Up Recommendations  SNF     Equipment Recommendations  None recommended by PT    Recommendations for Other Services OT consult     Precautions / Restrictions Precautions Precautions: Fall Precaution Comments: due to immobilility.   Restrictions Weight Bearing Restrictions: No    Mobility  Bed Mobility Overal bed mobility: Needs  Assistance Bed Mobility: Supine to Sit;Sit to Supine     Supine to sit: Max assist;HOB elevated Sit to supine: Total assist   General bed mobility comments: Pt requires Max A to sit on the EOB due to new air mattress with slick surface.  Pt was able to sit on the EOB for 8 min.  Transfers Overall transfer level:  (Pt expressed being too fatigued to attempt transfer from bed<>chair today. )                  Ambulation/Gait                 Stairs            Wheelchair Mobility    Modified Rankin (Stroke Patients Only)       Balance Overall balance assessment: Needs assistance Sitting-balance support: No upper extremity supported;Feet supported Sitting balance-Leahy Scale: Poor Sitting balance - Comments: Attemtped to have pt use UE's to assist with balance, however pt reported that it was too painful and it would pull in her UE's.  Pt assisted with seated weight shifting right and left  Postural control: Posterior lean;Left lateral lean                          Cognition Arousal/Alertness: Awake/alert   Overall Cognitive Status: Within Functional Limits for tasks assessed                      Exercises General Exercises - Lower  Extremity Long Arc Quad: Left;AAROM;10 reps;Seated    General Comments        Pertinent Vitals/Pain Pain Assessment: Faces Faces Pain Scale: Hurts even more Pain Location: R UE, and B feet Pain Intervention(s): Limited activity within patient's tolerance;Monitored during session;Repositioned    Home Living                      Prior Function            PT Goals (current goals can now be found in the care plan section) Acute Rehab PT Goals Patient Stated Goal: To move better PT Goal Formulation: With patient/family Time For Goal Achievement: 10/10/16 Potential to Achieve Goals: Fair Progress towards PT goals: Progressing toward goals    Frequency    Min 3X/week      PT Plan  Current plan remains appropriate    Co-evaluation             End of Session   Activity Tolerance: Patient limited by fatigue;Patient limited by pain Patient left: in bed;with call bell/phone within reach;with family/visitor present     Time: 1829-9371 PT Time Calculation (min) (ACUTE ONLY): 29 min  Charges:  $Therapeutic Activity: 23-37 mins                    G Codes:      Beth Marietta Sikkema, PT, DPT X: [phone]

## 2016-10-06 NOTE — Consult Note (Signed)
Consultation Note Date: 10/06/2016   Patient Name: Alyssa Coleman  DOB: 09-Feb-1941  MRN: [id number]  Age / Sex: 76 y.o., female  PCP: Pearson Grippe, MD Referring Physician: Micael Hampshire Acost*  Reason for Consultation: Establishing goals of care and Psychosocial/spiritual support  HPI/Patient Profile: 76 y.o. female  with past medical history of crippling rheumatoid arthritis, decreased Coleman ability for the last 2 months, hypertension, MI, GER D, community acquired pneumonia history admitted on 10/02/2016 with sepsis likely related to abscess of upper back and community acquired pneumonia.   Clinical Assessment and Goals of Care: Alyssa Coleman is lying quietly in bed. She greets me making eye contact as I enter. Present today at bedside are her husband, Alyssa Coleman, her sister Alyssa Coleman, and her friend Alyssa Coleman and her husband. We talk about Alyssa Coleman's functional status. Alyssa Coleman states that she fell getting into her lift chair 2 months ago and has decreased mobility stance. He states that she spends Coleman of her time in the chair now. We talk about her nutritional state. Family seems surprised that her weight is now 99 pounds. They state that she has not been eating well for the last few months, and that she has lost weight.  We talk about her health concerns including her abscess on her upper back, infection and wounds, and community acquired pneumonia. I share that decreased mobility often can lead to pneumonia, we talk about coughing and deep breathing every hour. I share my worry over increased white blood cells, even with the use of IV antibiotics. We talk about surgical consult, and limited options.  Alyssa Coleman is oriented to self only at this time. Husband Alyssa Coleman states that she has been this way for the last few weeks. I share that this is likely related to her infective process. We talk about Alyssa Coleman  staying in the hospital for a few more days, at least.  I share that she has been accepted to the [facility] for rehab, but I worry about this infection.   We plan to meet tomorrow, 2/13, at bedside, at 10:30.  Health care power of attorney  NEXT OF KIN - husband Alyssa Coleman   SUMMARY OF RECOMMENDATIONS   continue to treat the treatable at this point, SNF for rehab if possible. Family meeting 2/13 at 1030 to continue goals of care discussion  Code Status/Advance Care Planning:  DNR  Symptom Management:   per hospitalist  Palliative Prophylaxis:   Frequent Pain Assessment and Palliative Wound Care  Additional Recommendations (Limitations, Scope, Preferences):  Continue to treat the treatable but no extraordinary measures.  Psycho-social/Spiritual:   Desire for further Chaplaincy support:no  Additional Recommendations: Caregiving  Support/Resources and Education on Hospice  Prognosis:   < 3 months, or less, would not be surprising based on functional decline over the last 2 months (husband states she fell 2 months ago and has spent Coleman of her time in her recliner since), severe infection, wound with poor surgical options.   Discharge Planning: Mrs.  Coleman has a bed pending at [facility].      Primary Diagnoses: Present on Admission: . Sepsis (HCC)   I have reviewed the medical record, interviewed the patient and family, and examined the patient. The following aspects are pertinent.  Past Medical History:  Diagnosis Date  . Angina   . Anxiety   . Arthritis   . Blood transfusion   . CAP (community acquired pneumonia) 10/02/2016  . GERD (gastroesophageal reflux disease)   . Hypertension   . Myocardial infarction   . PONV (postoperative nausea and vomiting)   . Shortness of breath   . Spinal headache    Social History   Social History  . Marital status: Married    Spouse name: N/A  . Number of children: N/A  . Years of education: N/A    Social History Main Topics  . Smoking status: Never Smoker  . Smokeless tobacco: Never Used  . Alcohol use No  . Drug use: No  . Sexual activity: No   Other Topics Concern  . None   Social History Narrative  . None   History reviewed. No pertinent family history. Scheduled Meds: . aspirin EC  81 mg Oral BID  . calcium-vitamin D  1 tablet Oral Daily  . cefTRIAXone (ROCEPHIN)  IV  1 g Intravenous Once  . cefTRIAXone (ROCEPHIN)  IV  1 g Intravenous Q24H  . cholecalciferol  1,000 Units Oral Daily  . enoxaparin (LOVENOX) injection  30 mg Subcutaneous Q24H  . feeding supplement (ENSURE ENLIVE)  237 mL Oral TID BM  . ferrous sulfate  325 mg Oral Q breakfast  . folic acid  1 mg Oral Daily  . hydrocortisone sod succinate (SOLU-CORTEF) inj  50 mg Intravenous Q8H  . multivitamin with minerals  1 tablet Oral Daily  . omega-3 acid ethyl esters  1 g Oral TID  . sodium chloride  1,000 mL Intravenous Once  . sodium chloride flush  10-40 mL Intracatheter Q12H  . thyroid  90 mg Oral QAC breakfast  . vancomycin  750 mg Intravenous Q24H   Continuous Infusions: PRN Meds:.acetaminophen **OR** acetaminophen, ondansetron **OR** ondansetron (ZOFRAN) IV, oxyCODONE, senna-docusate, sodium chloride flush Medications Prior to Admission:  Prior to Admission medications   Medication Sig Start Date End Date Taking? Authorizing Provider  acetaminophen (TYLENOL) 500 MG tablet Take 500 mg by mouth every 6 (six) hours as needed.   Yes Historical Provider, MD  acidophilus (RISAQUAD) CAPS capsule Take 1 capsule by mouth daily.   Yes Historical Provider, MD  ARMOUR THYROID 60 MG tablet Take 1.5 tablets by mouth daily. 08/11/16  Yes Historical Provider, MD  aspirin 81 MG tablet Take 1 tablet (81 mg total) by mouth 2 (two) times daily. 09/19/11  Yes Lanney Gins, PA-C  Calcium Carbonate-Vitamin D (CALTRATE 600+D) 600-400 MG-UNIT per tablet Take 1 tablet by mouth daily.   Yes Historical Provider, MD   cholecalciferol (VITAMIN D) 1000 UNITS tablet Take 1,000 Units by mouth daily.   Yes Historical Provider, MD  ferrous sulfate 325 (65 FE) MG EC tablet Take 325 mg by mouth daily with breakfast.   Yes Historical Provider, MD  fish oil-omega-3 fatty acids 1000 MG capsule Take 1 g by mouth 3 (three) times daily.   Yes Historical Provider, MD  folic acid (FOLVITE) 1 MG tablet Take 1 mg by mouth daily.   Yes Historical Provider, MD  lisinopril (PRINIVIL,ZESTRIL) 5 MG tablet Take 5 mg by mouth daily.   Yes Historical  Provider, MD  Multiple Vitamin (MULITIVITAMIN WITH MINERALS) TABS Take 1 tablet by mouth daily.   Yes Historical Provider, MD  predniSONE (DELTASONE) 5 MG tablet Take 10 mg by mouth daily. 02/23/13  Yes Historical Provider, MD  traMADol (ULTRAM) 50 MG tablet Take 1 tablet (50 mg total) by mouth every 6 (six) hours as needed for pain. 04/19/13  Yes Dione Booze, MD   Allergies  Allergen Reactions  . Adult Aspirin Ec [Aspirin] Nausea Only  . Codeine Nausea Only  . Hydrocodone Hives   Review of Systems  Unable to perform ROS: Mental status change    Physical Exam  Constitutional: No distress.  Lying quietly in bed, makes eye contact, chronically ill appearing  HENT:  Head: Normocephalic and atraumatic.  Left ear with bandage covering pressure wound.  Neck:  Left head tilt  Cardiovascular: Normal rate and regular rhythm.   Pulmonary/Chest: Effort normal. No respiratory distress.  Musculoskeletal: She exhibits deformity. She exhibits no edema.  Crippling rheumatoid arthritis, left head tilt  Neurological: She is alert.  Oriented to person only  Skin: Skin is warm and dry.  Wound upper back  Nursing note and vitals reviewed.   Vital Signs: BP (!) 159/72 (BP Location: Left Leg)   Pulse 85   Temp 98.3 F (36.8 C) (Oral)   Resp 16   Ht 5\' 7"  (1.702 m)   Wt 45.3 kg (99 lb 13.9 oz)   SpO2 97%   BMI 15.64 kg/m  Pain Assessment: 0-10 POSS *See Group Information*:  1-Acceptable,Awake and alert Pain Score: 1    SpO2: SpO2: 97 % O2 Device:SpO2: 97 % O2 Flow Rate: .   IO: Intake/output summary:  Intake/Output Summary (Last 24 hours) at 10/06/16 1422 Last data filed at 10/06/16 1244  Gross per 24 hour  Intake              930 ml  Output              304 ml  Net              626 ml    LBM: Last BM Date: 10/05/16 Baseline Weight: Weight: 56.7 kg (125 lb) Coleman recent weight: Weight: 45.3 kg (99 lb 13.9 oz)     Palliative Assessment/Data:   Flowsheet Rows   Flowsheet Row Coleman Recent Value  Intake Tab  Referral Department  Hospitalist  Unit at Time of Referral  Med/Surg Unit  Palliative Care Primary Diagnosis  Sepsis/Infectious Disease  Date Notified  10/05/16  Palliative Care Type  New Palliative care  Reason for referral  Clarify Goals of Care  Date of Admission  10/02/16  Date first seen by Palliative Care  10/06/16  # of days Palliative referral response time  1 Day(s)  # of days IP prior to Palliative referral  3  Clinical Assessment  Palliative Performance Scale Score  30%  Pain Max last 24 hours  Not able to report  Pain Min Last 24 hours  Not able to report  Dyspnea Max Last 24 Hours  Not able to report  Dyspnea Min Last 24 hours  Not able to report  Psychosocial & Spiritual Assessment  Palliative Care Outcomes  Patient/Family meeting held?  Yes  Who was at the meeting?  Husband Alyssa Coleman, sister Alyssa Coleman, friend Alyssa Coleman and her husband  Palliative Care Outcomes  Clarified goals of care, Provided psychosocial or spiritual support  Patient/Family wishes: Interventions discontinued/not started   Mechanical Ventilation  Palliative Care follow-up planned  -- [  Follow-up while at [facility]]      Time In: 1140 Time Out: 1215 Time Total: 35 minutes Greater than 50%  of this time was spent counseling and coordinating care related to the above assessment and plan.  Signed by: Katheran Awe, NP   Please contact Palliative Medicine Team phone  at [phone] for questions and concerns.  For individual provider: See Loretha Stapler

## 2016-10-07 ENCOUNTER — Inpatient Hospital Stay
Admission: RE | Admit: 2016-10-07 | Discharge: 2016-10-11 | Disposition: A | Discharge status: Nursing Home (Includes ICF) | Payer: PPO | Source: Ambulatory Visit | Attending: Internal Medicine | Admitting: Internal Medicine

## 2016-10-07 DIAGNOSIS — Z7952 Long term (current) use of systemic steroids: Secondary | ICD-10-CM | POA: Diagnosis not present

## 2016-10-07 DIAGNOSIS — L02212 Cutaneous abscess of back [any part, except buttock]: Secondary | ICD-10-CM | POA: Diagnosis not present

## 2016-10-07 DIAGNOSIS — Z7189 Other specified counseling: Secondary | ICD-10-CM | POA: Diagnosis not present

## 2016-10-07 DIAGNOSIS — Z96642 Presence of left artificial hip joint: Secondary | ICD-10-CM | POA: Diagnosis not present

## 2016-10-07 DIAGNOSIS — F411 Generalized anxiety disorder: Secondary | ICD-10-CM | POA: Diagnosis not present

## 2016-10-07 DIAGNOSIS — Z515 Encounter for palliative care: Secondary | ICD-10-CM | POA: Diagnosis not present

## 2016-10-07 DIAGNOSIS — M6281 Muscle weakness (generalized): Secondary | ICD-10-CM | POA: Diagnosis not present

## 2016-10-07 DIAGNOSIS — A419 Sepsis, unspecified organism: Secondary | ICD-10-CM | POA: Diagnosis not present

## 2016-10-07 DIAGNOSIS — I10 Essential (primary) hypertension: Secondary | ICD-10-CM | POA: Diagnosis not present

## 2016-10-07 DIAGNOSIS — Z96652 Presence of left artificial knee joint: Secondary | ICD-10-CM | POA: Diagnosis not present

## 2016-10-07 DIAGNOSIS — K219 Gastro-esophageal reflux disease without esophagitis: Secondary | ICD-10-CM | POA: Diagnosis not present

## 2016-10-07 DIAGNOSIS — R279 Unspecified lack of coordination: Secondary | ICD-10-CM | POA: Diagnosis not present

## 2016-10-07 DIAGNOSIS — M0549 Rheumatoid myopathy with rheumatoid arthritis of multiple sites: Secondary | ICD-10-CM | POA: Diagnosis not present

## 2016-10-07 DIAGNOSIS — Z96641 Presence of right artificial hip joint: Secondary | ICD-10-CM | POA: Diagnosis not present

## 2016-10-07 DIAGNOSIS — J189 Pneumonia, unspecified organism: Secondary | ICD-10-CM | POA: Diagnosis not present

## 2016-10-07 LAB — CULTURE, BLOOD (ROUTINE X 2)
CULTURE: NO GROWTH
Culture: NO GROWTH

## 2016-10-07 LAB — CBC
HCT: 34.3 % — ABNORMAL LOW (ref 36.0–46.0)
Hemoglobin: 11.7 g/dL — ABNORMAL LOW (ref 12.0–15.0)
MCH: 35.8 pg — ABNORMAL HIGH (ref 26.0–34.0)
MCHC: 34.1 g/dL (ref 30.0–36.0)
MCV: 104.9 fL — AB (ref 78.0–100.0)
PLATELETS: 385 10*3/uL (ref 150–400)
RBC: 3.27 MIL/uL — ABNORMAL LOW (ref 3.87–5.11)
RDW: 13 % (ref 11.5–15.5)
WBC: 24.8 10*3/uL — AB (ref 4.0–10.5)

## 2016-10-07 MED ORDER — SULFAMETHOXAZOLE-TRIMETHOPRIM 800-160 MG PO TABS: 1.0000 | ORAL_TABLET | Freq: Two times a day (BID) | ORAL | 0 refills | Status: DC

## 2016-10-07 NOTE — Progress Notes (Signed)
Patient with orders to be discharge to [facility]. Report given, to Mo, nurse. Discharge packet sent with patient. Patient stable. Patient transported via staff to [facility].

## 2016-10-07 NOTE — Clinical Social Work Note (Signed)
[facility] still has been available at this time. Per [facility], insurance authorization will need to be started again when pt is medically stable.   Derenda Fennel, LCSW [phone]

## 2016-10-07 NOTE — Progress Notes (Signed)
Daily Progress Note   Patient Name: MARIJAH LARRANAGA       Date: 10/07/2016 DOB: [birth year]  Age: 76 y.o. MRN#: [id number] Attending Physician: Memory Argue* Primary Care Physician: Pearson Grippe, MD Admit Date: 10/02/2016  Reason for Consultation/Follow-up: Establishing goals of care and Psychosocial/spiritual support  Subjective: Mrs. Giammona is resting quietly in bed. She is pleasant, and greets me as I enter. She is oriented to person only. Present today at bedside are her husband Leonette Most, sisters Gaspar Garbe, and [place].  We go to my office for a meeting. We talk about her functional status over the last few months. Mr. Fore shares that she has basically been chair bound for the last 2 months. I ask how she toilets and he states that he assist her with the bedpan in the chair. He shares that she has had both left and right hip replacements, and a left knee replacement. She is been contracted in her arms for the last 3 to 4 months. We talk about her weight and appetite. He states that he has had to feed her for the last several months. She has lost weight. We talk about her physical therapy evaluation and treatment yesterday.  We talk about the surgeons recommendations for the abscess on her upper back, to treat medically, she is not a candidate for any surgical intervention. I share my concern over necrotic skin, and such a large area.  We talk at length about her lab results, her increased white blood cells. He shares that they have come down. I share that, yes they have come down, but they are still very high. We review her chest x-ray, and talk about the problems that come with immobility including pneumonia that is recurrent, urinary tract infections, and skin breakdown (she has  a sacral wound at this time).  I share a diagram of the chronic illness pathway. I share my worry over her frail state, and her expected continued declines. I share my worry that her white blood cells are still around 24 even after 5 days of IV antibiotics. I asked Mr. Wolfgang what worries him the most. He states that he wants to see her "get on her feet and do for herself". I share that I do not believe she will be able to walk, we talk about  her functional decline, being chair bound for the last 2 months. Rivka Barbara states her worry is, "the whole picture". Thelma Barge states that she has seen her sisters decline over the last few months.   I share that we are continuing to treat Mrs. Fredric Mare, with IV fluids, antibiotics, physical therapy. I share that she is still scheduled to go to [facility], we talk about 3 weeks at no cost and then 100 days at co-pay. We talk about advanced directives, allow a natural death. Family states that she told them Wednesday not that she was, "ready to go home". They also stay that last night she talked to her deceased mother and father. I share prognosis with their permission. I share that 3 months or less would not be surprising, but, she could worsen at any point. I share my concern over her communication with her deceased parents. Sisters are tearful, I reassure them that we are continuing to treat her. We plan for a family meeting Thursday 2/15 at 1300. is tearful  Length of Stay: 5  Current Medications: Scheduled Meds:  . aspirin EC  81 mg Oral BID  . calcium-vitamin D  1 tablet Oral Daily  . cefTRIAXone (ROCEPHIN)  IV  1 g Intravenous Once  . cefTRIAXone (ROCEPHIN)  IV  1 g Intravenous Q24H  . cholecalciferol  1,000 Units Oral Daily  . enoxaparin (LOVENOX) injection  30 mg Subcutaneous Q24H  . feeding supplement (ENSURE ENLIVE)  237 mL Oral TID BM  . ferrous sulfate  325 mg Oral Q breakfast  . folic acid  1 mg Oral Daily  . hydrocortisone sod succinate  (SOLU-CORTEF) inj  50 mg Intravenous Q8H  . multivitamin with minerals  1 tablet Oral Daily  . omega-3 acid ethyl esters  1 g Oral TID  . sodium chloride  1,000 mL Intravenous Once  . sodium chloride flush  10-40 mL Intracatheter Q12H  . thyroid  90 mg Oral QAC breakfast  . vancomycin  750 mg Intravenous Q24H    Continuous Infusions:   PRN Meds: acetaminophen **OR** acetaminophen, ondansetron **OR** ondansetron (ZOFRAN) IV, oxyCODONE, senna-docusate, sodium chloride flush  Physical Exam  Constitutional: No distress.  Frail and thin, chronically ill appearing  HENT:  Head: Normocephalic and atraumatic.  Cardiovascular: Normal rate and regular rhythm.   Pulmonary/Chest: Effort normal. No respiratory distress.  Abdominal: Soft. She exhibits no distension.  Musculoskeletal: She exhibits no edema.  Neurological: She is alert.  Oriented to person only  Skin: Skin is warm and dry.  Wound up her mid back, dressing in place  Nursing note and vitals reviewed.           Vital Signs: BP (!) 155/63 (BP Location: Left Arm)   Pulse 80   Temp 98.7 F (37.1 C) (Oral)   Resp 18   Ht 5\' 7"  (1.702 m)   Wt 45.3 kg (99 lb 13.9 oz)   SpO2 97%   BMI 15.64 kg/m  SpO2: SpO2: 97 % O2 Device: O2 Device: Not Delivered O2 Flow Rate:    Intake/output summary:  Intake/Output Summary (Last 24 hours) at 10/07/16 1254 Last data filed at 10/07/16 0600  Gross per 24 hour  Intake               50 ml  Output              500 ml  Net             -450 ml  LBM: Last BM Date: 10/05/16 Baseline Weight: Weight: 56.7 kg (125 lb) Most recent weight: Weight: 45.3 kg (99 lb 13.9 oz)       Palliative Assessment/Data:    Flowsheet Rows   Flowsheet Row Most Recent Value  Intake Tab  Referral Department  Hospitalist  Unit at Time of Referral  Med/Surg Unit  Palliative Care Primary Diagnosis  Sepsis/Infectious Disease  Date Notified  10/05/16  Palliative Care Type  New Palliative care  Reason for  referral  Clarify Goals of Care  Date of Admission  10/02/16  Date first seen by Palliative Care  10/06/16  # of days Palliative referral response time  1 Day(s)  # of days IP prior to Palliative referral  3  Clinical Assessment  Palliative Performance Scale Score  30%  Pain Max last 24 hours  Not able to report  Pain Min Last 24 hours  Not able to report  Dyspnea Max Last 24 Hours  Not able to report  Dyspnea Min Last 24 hours  Not able to report  Psychosocial & Spiritual Assessment  Palliative Care Outcomes  Patient/Family meeting held?  Yes  Who was at the meeting?  Husband Leonette Most, sister Rivka Barbara, friend Kathie Rhodes and her husband  Palliative Care Outcomes  Clarified goals of care, Provided psychosocial or spiritual support  Patient/Family wishes: Interventions discontinued/not started   Mechanical Ventilation  Palliative Care follow-up planned  -- [Follow-up while at [facility]]      Patient Active Problem List   Diagnosis Date Noted  . Palliative care encounter   . Goals of care, counseling/discussion   . Sepsis (HCC) 10/02/2016  . Community acquired pneumonia 10/02/2016  . Back abscess 10/02/2016  . 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 female  with past medical history of crippling rheumatoid arthritis, decreased most ability for the last 2 months, hypertension, MI, GER D, community acquired pneumonia history admitted on 10/02/2016 with sepsis likely related to abscess of upper back and community acquired pneumonia.  Assessment: Abscess mid-upper back; I&D performed in ED, wound care continues, surgical consult recommending medical management at this time. Continued with increased white blood cells at 24, necrotic skin is worrisome. functional decline/failure to thrive;  decrease mobility for the last 2 months (husband states that she has been in her recliner for 2 months, he  helps her with the bedpan for toileting in the recliner), decreased ability to feed herself over the last 2 months, weight loss.   Recommendations/Plan:  continue to treat the treatable at this point, but no extraordinary measures such as CPR or intubation. We discussed the MOST form, and discuss the concepts of let nature take its course, the use of IV fluids and IV antibiotics. We talk about PEG tube placement. No decisions regarding end-of-life care, other than no CPR no intubation, are made today.  Goals of Care and Additional Recommendations:  Limitations on Scope of Treatment: Treat the treatable but no extraordinary measures such as CPR or intubation. We discuss, but do not complete, the MOST form  Code Status:    Code Status Orders        Start     Ordered   10/02/16 1713  Do not attempt resuscitation (DNR)  Continuous    Question Answer Comment  In the event of cardiac or respiratory ARREST Do not call a "code blue"   In the event of cardiac or  respiratory ARREST Do not perform Intubation, CPR, defibrillation or ACLS   In the event of cardiac or respiratory ARREST Use medication by any route, position, wound care, and other measures to relive pain and suffering. May use oxygen, suction and manual treatment of airway obstruction as needed for comfort.      10/02/16 1712    Code Status History    Date Active Date Inactive Code Status Order ID Comments User Context   09/18/2011 12:20 AM 09/19/2011  2:58 PM Full Code [id number]  Estill Dooms, RN Inpatient       Prognosis:   < 3 months or less, would not be surprising based on functional decline over the last 2 months (husband states she fell 2 months ago and has spent most of her time in her recliner since), severe infection, wound with poor surgical options.   Family states today that Mrs. Cavalcante has been talking to her deceased parents. I share (with their permission) that this is worrisome, and that she may be  closer to death. We discuss prognosis.  Discharge Planning:  [facility] for rehab/antibiotics.  Care plan was discussed with nursing staff, case manager, social worker, and Dr. Viann Fish.  Thank you for allowing the Palliative Medicine Team to assist in the care of this patient.   Time In: 1040 Time Out: 1150 Total Time 70 minutes  Prolonged Time Billed  yes       Greater than 50%  of this time was spent counseling and coordinating care related to the above assessment and plan.  Katheran Awe, NP  Please contact Palliative Medicine Team phone at [phone] for questions and concerns.

## 2016-10-07 NOTE — Discharge Summary (Signed)
Physician Discharge Summary  Alyssa Coleman KLK:[id number] DOB: 1941/01/31 DOA: 10/02/2016  PCP: Pearson Grippe, MD  Admit date: 10/02/2016 Discharge date: 10/07/2016  Time spent: 45 minutes  Recommendations for Outpatient Follow-up:  -Will be discharged to SNF today. -Recommend palliative care to follow at SNF to continue to discuss GOC.   Discharge Diagnoses:  Principal Problem:   Sepsis [facility]) Active Problems:   Community acquired pneumonia   Back abscess   Palliative care encounter   Goals of care, counseling/discussion   Discharge Condition: Stable but guarded  Filed Weights   10/02/16 1050 10/02/16 1630  Weight: 56.7 kg (125 lb) 45.3 kg (99 lb 13.9 oz)    History of present illness:   Alyssa Coleman is a 76 y.o. female with past medical history significant for GERD, rheumatoid arthritis on chronic prednisone who presents to the hospital today with the above complaints. Patient states that she has become increasingly weak and deconditioned over the past 3 months. Over the past 4-5 days she has been complaining of increased pain of her back. Upon presentation to the ED she was noticed to have a softball size abscess to her upper back between her scapulas, she was also noted to have signs of sepsis with a marginal blood pressure, tachycardia and elevated lactic acid as well as a WBC count of 34.5. EDP performed incision and drainage of the abscess in the ED yielding approximately 60 mL of purulent material which has been sent for culture. Chest x-ray also shows findings significant for pneumonia. Her head is tilted towards the left, this has caused a small pressure ulcer of her left earlobe. Admission requested.  Hospital Course:   Sepsis -Sepsis parameters have resolved, lactic acid and blood pressure have normalized, remains with leukocytosis of 24.8 that is improved from admission.  Back abscess -Status post I and D. -Has been on vancomycin for 6 days while  hospitalized. -Per surgical recommendations, will transitionover to Bactrim DS for 2 weeks. He does not recommend surgical debridement, just continued wound care at [facility].  Community-acquired pneumonia -Has completed abx course.  Recommend continued palliative care follow up at SNF to further determine GOC as her short term prognosis is poor.  Procedures:  None   Consultations:  Surgery  Palliative care  Discharge Instructions  Discharge Instructions    Increase activity slowly    Complete by:  As directed      Allergies as of 10/07/2016      Reactions   Adult Aspirin Ec [aspirin] Nausea Only   Codeine Nausea Only   Hydrocodone Hives      Medication List    TAKE these medications   acetaminophen 500 MG tablet Commonly known as:  TYLENOL Take 500 mg by mouth every 6 (six) hours as needed.   acidophilus Caps capsule Take 1 capsule by mouth daily.   ARMOUR THYROID 60 MG tablet Generic drug:  thyroid Take 1.5 tablets by mouth daily.   aspirin 81 MG tablet Take 1 tablet (81 mg total) by mouth 2 (two) times daily.   CALTRATE 600+D 600-400 MG-UNIT tablet Generic drug:  Calcium Carbonate-Vitamin D Take 1 tablet by mouth daily.   cholecalciferol 1000 units tablet Commonly known as:  VITAMIN D Take 1,000 Units by mouth daily.   ferrous sulfate 325 (65 FE) MG EC tablet Take 325 mg by mouth daily with breakfast.   fish oil-omega-3 fatty acids 1000 MG capsule Take 1 g by mouth 3 (three) times daily.  folic acid 1 MG tablet Commonly known as:  FOLVITE Take 1 mg by mouth daily.   lisinopril 5 MG tablet Commonly known as:  PRINIVIL,ZESTRIL Take 5 mg by mouth daily.   multivitamin with minerals Tabs tablet Take 1 tablet by mouth daily.   predniSONE 5 MG tablet Commonly known as:  DELTASONE Take 10 mg by mouth daily.   sulfamethoxazole-trimethoprim 800-160 MG tablet Commonly known as:  BACTRIM DS,SEPTRA DS Take 1 tablet by mouth 2 (two) times daily.    traMADol 50 MG tablet Commonly known as:  ULTRAM Take 1 tablet (50 mg total) by mouth every 6 (six) hours as needed for pain.      Allergies  Allergen Reactions  . Adult Aspirin Ec [Aspirin] Nausea Only  . Codeine Nausea Only  . Hydrocodone Hives   Contact information for after-discharge care    Destination    [facility] .   Specialty:  Skilled Nursing Facility Contact information: [address] [place] [address] [phone]               The results of significant diagnostics from this hospitalization (including imaging, microbiology, ancillary and laboratory) are listed below for reference.    Significant Diagnostic Studies: Dg Chest Portable 1 View  Result Date: 10/02/2016 CLINICAL DATA:  Weakness. EXAM: PORTABLE CHEST 1 VIEW COMPARISON:  12/21/2004 FINDINGS: Bibasilar opacities, likely atelectasis at the right base. Left basilar opacity could reflect atelectasis or infiltrate. Heart is borderline in size. No effusions or acute bony abnormality. Old healed proximal left humeral fracture. IMPRESSION: Bibasilar atelectasis or infiltrates, left greater than right. Electronically Signed   By: Charlett Nose M.D.   On: 10/02/2016 11:29    Microbiology: Recent Results (from the past 240 hour(s))  Culture, blood (routine x 2)     Status: None   Collection Time: 10/02/16 11:46 AM  Result Value Ref Range Status   Specimen Description BLOOD RIGHT HAND  Final   Special Requests BOTTLES DRAWN AEROBIC AND ANAEROBIC 6CC  Final   Culture NO GROWTH 5 DAYS  Final   Report Status 10/07/2016 FINAL  Final  Culture, blood (routine x 2)     Status: None   Collection Time: 10/02/16 11:58 AM  Result Value Ref Range Status   Specimen Description BLOOD LEFT HAND  Final   Special Requests BOTTLES DRAWN AEROBIC AND ANAEROBIC 6CC  Final   Culture NO GROWTH 5 DAYS  Final   Report Status 10/07/2016 FINAL  Final  Wound or Superficial Culture     Status:  None   Collection Time: 10/02/16 12:30 PM  Result Value Ref Range Status   Specimen Description BACK  Final   Special Requests NONE  Final   Gram Stain   Final    MODERATE WBC PRESENT,BOTH PMN AND MONONUCLEAR FEW GRAM POSITIVE COCCI IN PAIRS Performed at [facility], [address] [place], [place] [address]    Culture FEW PROTEUS MIRABILIS  Final   Report Status 10/05/2016 FINAL  Final     Labs: Basic Metabolic Panel:  Recent Labs Lab 10/02/16 1104 10/02/16 1735 10/02/16 2020 10/03/16 0414 10/05/16 0633  NA 133* 133*  --  135 137  K 2.8* 2.7*  --  4.7 4.3  CL 98* 98*  --  104 103  CO2 24 26  --  25 28  GLUCOSE 128* 131*  --  124* 100*  BUN 11 11  --  13 18  CREATININE 0.45  0.44  --  0.47 0.41*  CALCIUM 10.3 10.2  --  10.0 10.0  MG  --   --  1.2*  --   --    Liver Function Tests:  Recent Labs Lab 10/02/16 1104 10/02/16 1735  AST 22 16  ALT 11* 10*  ALKPHOS 114 101  BILITOT 0.6 0.7  PROT 5.7* 4.8*  ALBUMIN 2.1* 1.8*   No results for input(s): LIPASE, AMYLASE in the last 168 hours. No results for input(s): AMMONIA in the last 168 hours. CBC:  Recent Labs Lab 10/02/16 1104 10/02/16 1735 10/03/16 0414 10/04/16 0642 10/05/16 0633 10/06/16 0401 10/07/16 0625  WBC 34.5* 36.4* 31.1* 29.5* 29.1* 25.3* 24.8*  NEUTROABS 29.4* 34.0*  --   --   --   --   --   HGB 14.4 12.8 12.0 11.6* 11.7* 11.2* 11.7*  HCT 41.2 36.3 34.9* 34.5* 34.9* 33.1* 34.3*  MCV 103.0* 103.1* 103.9* 105.8* 107.1* 105.8* 104.9*  PLT 554* 433* 468* 450* 425* 382 385   Cardiac Enzymes:  Recent Labs Lab 10/02/16 1104  TROPONINI <0.03   BNP: BNP (last 3 results) No results for input(s): BNP in the last 8760 hours.  ProBNP (last 3 results) No results for input(s): PROBNP in the last 8760 hours.  CBG:  Recent Labs Lab 10/02/16 1111  GLUCAP 106*       Signed:  Chaya Jan  [facility] Pager: [phone] 10/07/2016, 3:29 PM

## 2016-10-08 ENCOUNTER — Non-Acute Institutional Stay (SKILLED_NURSING_FACILITY): Payer: PPO | Admitting: Internal Medicine

## 2016-10-08 ENCOUNTER — Encounter: Payer: Self-pay | Admitting: Internal Medicine

## 2016-10-08 DIAGNOSIS — J189 Pneumonia, unspecified organism: Secondary | ICD-10-CM | POA: Diagnosis not present

## 2016-10-08 DIAGNOSIS — M069 Rheumatoid arthritis, unspecified: Secondary | ICD-10-CM | POA: Diagnosis not present

## 2016-10-08 DIAGNOSIS — E039 Hypothyroidism, unspecified: Secondary | ICD-10-CM | POA: Diagnosis not present

## 2016-10-08 DIAGNOSIS — I10 Essential (primary) hypertension: Secondary | ICD-10-CM

## 2016-10-08 DIAGNOSIS — L02212 Cutaneous abscess of back [any part, except buttock]: Secondary | ICD-10-CM

## 2016-10-08 NOTE — Progress Notes (Signed)
Location:   [facility] Room Number: 130/P Place of Service:  SNF (31) Provider:  Lucretia Field, MD  Patient Care Team: Jani Gravel, MD as PCP - General (Internal Medicine)  Extended Emergency Contact Information Primary Emergency Contact: Noah Charon Address: [address]          [place], [place] [address] [place] Phone: [phone] Relation: Spouse  Code Status:  Full Code Goals of care: Advanced Directive information Advanced Directives 10/08/2016  Does Patient Have a Medical Advance Directive? Yes  Type of Advance Directive (No Data)  Does patient want to make changes to medical advance directive? No - Patient declined  Would patient like information on creating a medical advance directive? No - Patient declined  Pre-existing out of facility DNR order (yellow form or pink MOST form) -     Chief Complaint  Patient presents with  . Acute Visit  Status post hospitalization for back abscess-pneumonia  HPI:  Pt is a 76 y.o. female seen today for an acute visit for follow-up of hospitalization for back abscess-, it would pneumonia.  Patient has a history of GERD rheumatoid arthritis on chronic prednisone presented to the hospital with complaints of back pain.  She also said she had been increasingly weak and deconditioned over the past 3 months.  Back pain increased over the past several days prior to hospitalization.  She was noted to have a softball size abscess to her upper back-also noted to have signs of sepsis with marginal blood pressure tachycardia elevated lactic acid and a white count of 34,500.  Emergency department performed an incision and drainage of the abscess.  Chest x-ray showed findings significant for pneumonia.  Sepsis was treated aggressively with antibiotics he is been discharged on Bactrim-leukocytosis on discharge was down to 24,800.  Back abscess additionally was treated with vancomycin for  6 days-she is been transitioned over to Bactrim DS for 2 weeks oral form.  It was not thought surgical debridement was necessary at this time will need aggressive wound care.  She also has a history of chronic rheumatoid arthritis she is on low dose prednisone apparently this has been fairly stable for some time.  Currently she is resting in bed comfortably she continues to be very weak and frail that has been suggestive for a palliative care consult secondary to patient's poor prognosis.     Past Medical History:  Diagnosis Date  . Angina   . Anxiety   . Arthritis   . Blood transfusion   . CAP (community acquired pneumonia) 10/02/2016  . GERD (gastroesophageal reflux disease)   . Hypertension   . Myocardial infarction   . PONV (postoperative nausea and vomiting)   . Shortness of breath   . Spinal headache    Past Surgical History:  Procedure Laterality Date  . CARDIAC CATHETERIZATION    . HAND SURGERY  1980   skin infection  . JOINT REPLACEMENT    . ORIF PERIPROSTHETIC FRACTURE  09/17/2011   Procedure: OPEN REDUCTION INTERNAL FIXATION (ORIF) PERIPROSTHETIC FRACTURE;  Surgeon: Mauri Pole, MD;  Location: [facility];  Service: Orthopedics;  Laterality: Left;  . TOTAL HIP ARTHROPLASTY  Feb 97   left  . TOTAL HIP ARTHROPLASTY  aug 97   right hip  . TOTAL KNEE ARTHROPLASTY  1998   left    Allergies  Allergen Reactions  . Adult Aspirin Ec [Aspirin] Nausea Only  . Codeine Nausea Only  . Hydrocodone  Hives    Current Outpatient Prescriptions on File Prior to Visit  Medication Sig Dispense Refill  . acetaminophen (TYLENOL) 500 MG tablet Take 500 mg by mouth every 6 (six) hours as needed.    Marland Kitchen acidophilus (RISAQUAD) CAPS capsule Take 1 capsule by mouth daily.    Francia Greaves THYROID 60 MG tablet Take 1.5 tablets by mouth daily.  0  . aspirin 81 MG tablet Take 1 tablet (81 mg total) by mouth 2 (two) times daily. 30 tablet   . Calcium Carbonate-Vitamin D (CALTRATE 600+D) 600-400  MG-UNIT per tablet Take 1 tablet by mouth daily.    . cholecalciferol (VITAMIN D) 1000 UNITS tablet Take 1,000 Units by mouth daily.    . ferrous sulfate 325 (65 FE) MG EC tablet Take 325 mg by mouth daily with breakfast.    . fish oil-omega-3 fatty acids 1000 MG capsule Take 1 g by mouth 3 (three) times daily.    . folic acid (FOLVITE) 1 MG tablet Take 1 mg by mouth daily.    Marland Kitchen lisinopril (PRINIVIL,ZESTRIL) 5 MG tablet Take 5 mg by mouth daily.    . Multiple Vitamin (MULITIVITAMIN WITH MINERALS) TABS Take 1 tablet by mouth daily.    . predniSONE (DELTASONE) 5 MG tablet Take 10 mg by mouth daily.    Marland Kitchen sulfamethoxazole-trimethoprim (BACTRIM DS,SEPTRA DS) 800-160 MG tablet Take 1 tablet by mouth 2 (two) times daily. 28 tablet 0  . traMADol (ULTRAM) 50 MG tablet Take 1 tablet (50 mg total) by mouth every 6 (six) hours as needed for pain. 20 tablet 0   No current facility-administered medications on file prior to visit.      Review of Systems   In general she is not complaining of fever chills is denying pain currently.  Skin does have chronic bruising it appears most prominently arms bilaterally she also has abscess drainage site covered on back as well as covering of the upper sacral area and over heels bilaterally.  Head ears eyes nose mouth and throat does not complaining of sore throat visual changes or headache at this time.  Respiratory denies shortness of breath or cough.  Cardiac does not complaining of chest pain or significant lower extremity edema.  GI does not complain of nausea vomiting diarrhea constipation or abdominal discomfort.  GU is not complaining of dysuria.  Muscle skeletal has a history of rheumatoid arthritis with diffuse deformities but does not complaining currently of joint pain.  Neurologic is not complaining of dizziness headache or syncope.  Psych is not complaining of overt anxiety or depression.     There is no immunization history on file for  this patient. There are no preventive care reminders to display for this patient. No flowsheet data found. Functional Status Survey:    Vitals:   10/08/16 1204  BP: (!) 145/70  Pulse: 92  Resp: 18  Temp: 97.5 F (36.4 C)  TempSrc: Oral  SpO2: 100%    Physical Exam In general this is a frail elderly female in no distress lying in bed she does not appear to be uncomfortable.  Her skin is warm and dry does have numerous bruising most possible upper arms bilaterally this appears to be chronic also has covering over the abscess site mid back as well as covering over the upper sacral area in her heels bilaterally area  Eyes pupils appear reactive to light bilaterally sclera and conjunctiva are clear except for what appears to be possible mild subconjunctival hemorrhage lateral left  by.  Lenice Llamas is clear mucous membranes moist.  Chest is clear to auscultation with somewhat poor respiratory effort there is no labored breathing.  Heart is regular rate and rhythm without murmur gallop or rub.  Abdomen is soft does not appear to be tender there are positive bowel sounds.  Muscle skeletal holds her upper extremities and somewhat contracted position there are arthritic changes of her hands and fingers bilaterally with contractures.  Currently has her feet in protective boots bilaterally with general frailty and significant weakness of her lower extremities bilaterally-there also is some scabbing of toes bilaterally  Neurologic is grossly intact could not really appreciate any lateralizing findings.  Psych she is oriented to self is pleasant and appropriate appear to give a fairly accurate history.   Labs reviewed:  Recent Labs  10/02/16 1735 10/02/16 2020 10/03/16 0414 10/05/16 0633  NA 133*  --  135 137  K 2.7*  --  4.7 4.3  CL 98*  --  104 103  CO2 26  --  25 28  GLUCOSE 131*  --  124* 100*  BUN 11  --  13 18  CREATININE 0.44  --  0.47 0.41*  CALCIUM 10.2  --  10.0  10.0  MG  --  1.2*  --   --     Recent Labs  10/02/16 1104 10/02/16 1735  AST 22 16  ALT 11* 10*  ALKPHOS 114 101  BILITOT 0.6 0.7  PROT 5.7* 4.8*  ALBUMIN 2.1* 1.8*    Recent Labs  10/02/16 1104 10/02/16 1735  10/05/16 0633 10/06/16 0401 10/07/16 0625  WBC 34.5* 36.4*  < > 29.1* 25.3* 24.8*  NEUTROABS 29.4* 34.0*  --   --   --   --   HGB 14.4 12.8  < > 11.7* 11.2* 11.7*  HCT 41.2 36.3  < > 34.9* 33.1* 34.3*  MCV 103.0* 103.1*  < > 107.1* 105.8* 104.9*  PLT 554* 433*  < > 425* 382 385  < > = values in this interval not displayed. No results found for: TSH No results found for: HGBA1C No results found for: CHOL, HDL, LDLCALC, LDLDIRECT, TRIG, CHOLHDL  Significant Diagnostic Results in last 30 days:  Dg Chest Portable 1 View  Result Date: 10/02/2016 CLINICAL DATA:  Weakness. EXAM: PORTABLE CHEST 1 VIEW COMPARISON:  12/21/2004 FINDINGS: Bibasilar opacities, likely atelectasis at the right base. Left basilar opacity could reflect atelectasis or infiltrate. Heart is borderline in size. No effusions or acute bony abnormality. Old healed proximal left humeral fracture. IMPRESSION: Bibasilar atelectasis or infiltrates, left greater than right. Electronically Signed   By: Rolm Baptise M.D.   On: 10/02/2016 11:29    Assessment/Plan  #1 history of back abscess -this was drained-at this point no surgical intervention desired she is completing a course of Bactrim after initially receiving vancomycin at this point is not complaining of pain this will have to be monitored and will need aggressive wound care.  White count yesterday was 24,800 which is coming down we will update this tomorrow  #2 history of pneumonia apparently this is thought to have resolved status post antibiotic she does not complaining of a cough or congestion today at this point monitor.  #3 history of hypertension she is on low-dose lisinopril systolic is mildly elevated today apparently there were low blood  pressures at times in the hospital at this point will monitor awaiting further readings.  #4 history rheumatoid arthritis apparently she has had this since age 26  per discussion with her husband she is on low-dose prednisone.  #5 history of anemia she is on iron hemoglobin of 11.7 appears to be stable will update this as well tomorrow.  #6 history of what appears to be hypokalemia in the hospital previously was 2.7 this has normalized I suspect it was supplemented this will need to update tomorrow as well.--I note magnesium was 1.2 on hospital lab as well will update this as well  #7 history hypothyroidism she is on Armour Thyroid will update a TSH.  #8 palliative care?--Family did meet with a palliative care practitioner in the hospital it appears there is another meeting scheduled for tomorrow about goals of care with  what appears to be poor prognosis weakness and comorbidities will await palliative care consult.  I do note her albumin was 2.1 on lab done in hospital.  [id number] note greater than 40 minutes spent assessing patient-reviewing her chart-reviewing her labs-and coordinating and formulating a plan of care for numerous diagnoses-of note greater than 50% of time spent coordinating plan of care #7         Oralia Manis, [place]

## 2016-10-09 ENCOUNTER — Encounter: Payer: Self-pay | Admitting: Internal Medicine

## 2016-10-09 ENCOUNTER — Non-Acute Institutional Stay (SKILLED_NURSING_FACILITY): Payer: PPO | Admitting: Internal Medicine

## 2016-10-09 ENCOUNTER — Encounter (HOSPITAL_COMMUNITY)
Admission: RE | Admit: 2016-10-09 | Discharge: 2016-10-09 | Disposition: A | Discharge status: Home / Self Care | Payer: PPO | Source: Skilled Nursing Facility | Attending: Internal Medicine | Admitting: Internal Medicine

## 2016-10-09 DIAGNOSIS — K219 Gastro-esophageal reflux disease without esophagitis: Secondary | ICD-10-CM | POA: Insufficient documentation

## 2016-10-09 DIAGNOSIS — L02212 Cutaneous abscess of back [any part, except buttock]: Secondary | ICD-10-CM

## 2016-10-09 DIAGNOSIS — Z7189 Other specified counseling: Secondary | ICD-10-CM | POA: Diagnosis not present

## 2016-10-09 DIAGNOSIS — J189 Pneumonia, unspecified organism: Secondary | ICD-10-CM | POA: Diagnosis not present

## 2016-10-09 DIAGNOSIS — I10 Essential (primary) hypertension: Secondary | ICD-10-CM | POA: Insufficient documentation

## 2016-10-09 DIAGNOSIS — F411 Generalized anxiety disorder: Secondary | ICD-10-CM | POA: Insufficient documentation

## 2016-10-09 DIAGNOSIS — A419 Sepsis, unspecified organism: Secondary | ICD-10-CM

## 2016-10-09 LAB — BASIC METABOLIC PANEL
ANION GAP: 12 (ref 5–15)
BUN: 11 mg/dL (ref 6–20)
CHLORIDE: 99 mmol/L — AB (ref 101–111)
CO2: 28 mmol/L (ref 22–32)
Calcium: 9.1 mg/dL (ref 8.9–10.3)
Creatinine, Ser: 0.43 mg/dL — ABNORMAL LOW (ref 0.44–1.00)
GFR calc Af Amer: >60 mL/min (ref >60)
GLUCOSE: 58 mg/dL — AB (ref 65–99)
POTASSIUM: 4 mmol/L (ref 3.5–5.1)
Sodium: 139 mmol/L (ref 135–145)

## 2016-10-09 LAB — CBC WITH DIFFERENTIAL/PLATELET
BASOS PCT: 0 %
Basophils Absolute: 0 10*3/uL (ref 0.0–0.1)
EOS PCT: 2 %
Eosinophils Absolute: 0.5 10*3/uL (ref 0.0–0.7)
HEMATOCRIT: 42.3 % (ref 36.0–46.0)
HEMOGLOBIN: 14 g/dL (ref 12.0–15.0)
LYMPHS PCT: 10 %
Lymphs Abs: 2.6 10*3/uL (ref 0.7–4.0)
MCH: 35.3 pg — AB (ref 26.0–34.0)
MCHC: 33.1 g/dL (ref 30.0–36.0)
MCV: 106.5 fL — AB (ref 78.0–100.0)
MONOS PCT: 7 %
Monocytes Absolute: 1.8 10*3/uL — ABNORMAL HIGH (ref 0.1–1.0)
NEUTROS PCT: 81 %
Neutro Abs: 20.8 10*3/uL — ABNORMAL HIGH (ref 1.7–7.7)
Platelets: 355 10*3/uL (ref 150–400)
RBC: 3.97 MIL/uL (ref 3.87–5.11)
RDW: 13.3 % (ref 11.5–15.5)
WBC: 25.7 10*3/uL — ABNORMAL HIGH (ref 4.0–10.5)

## 2016-10-09 LAB — MAGNESIUM: Magnesium: 1.9 mg/dL (ref 1.7–2.4)

## 2016-10-09 LAB — TSH: TSH: 4.528 u[IU]/mL — AB (ref 0.350–4.500)

## 2016-10-09 NOTE — Progress Notes (Signed)
Provider:  Einar Crow Location:   [facility] Room Number: 130/P Place of Service:  SNF (31)  PCP: Pearson Grippe, MD Patient Care Team: Pearson Grippe, MD as PCP - General (Internal Medicine)  Extended Emergency Contact Information Primary Emergency Contact: Fabian November Address: [address]          [place], [place] [address] [place] Home Phone: [phone] Relation: Spouse  Code Status: Full Code Goals of Care: Advanced Directive information Advanced Directives 10/09/2016  Does Patient Have a Medical Advance Directive? Yes  Type of Advance Directive (No Data)  Does patient want to make changes to medical advance directive? No - Patient declined  Would patient like information on creating a medical advance directive? No - Patient declined  Pre-existing out of facility DNR order (yellow form or pink MOST form) -      Chief Complaint  Patient presents with  . New Admit To SNF    HPI: Patient is a 76 y.o. female seen today for admission to SNF for Wound care and Therapy. Patient has h/o Rheumatoid arthritis since she was 76 year old. She has been on many medicines before but has been on Prednisone 5mg  for past 6 months.  According to the Husband who is primary caregiver for Patient says that patient had been Confided in the Lift chair for past 2 months. She has been unable to go to bathroom and he has been using Bed pan most of the time. Since he is only caregiver he was not able to turn her completely and clean her back. She developed 2 round size wound on her back and got worse very fast in a week. They were Debrided in the ED. It drained purulent fluid. She was also seen by Surgery who did not think her wound can be debrided anymore.   Patient has pain when try to change position. She denied any fever or chills. She has not been eating well and has lost almost 30 lbs in past 6 months. She denies any nausea or vomiting.    Past Medical  History:  Diagnosis Date  . Angina   . Anxiety   . Arthritis   . Blood transfusion   . CAP (community acquired pneumonia) 10/02/2016  . GERD (gastroesophageal reflux disease)   . Hypertension   . Myocardial infarction   . PONV (postoperative nausea and vomiting)   . Shortness of breath   . Spinal headache    Past Surgical History:  Procedure Laterality Date  . CARDIAC CATHETERIZATION    . HAND SURGERY  1980   skin infection  . JOINT REPLACEMENT    . ORIF PERIPROSTHETIC FRACTURE  09/17/2011   Procedure: OPEN REDUCTION INTERNAL FIXATION (ORIF) PERIPROSTHETIC FRACTURE;  Surgeon: Shelda Pal, MD;  Location: [facility];  Service: Orthopedics;  Laterality: Left;  . TOTAL HIP ARTHROPLASTY  Feb 97   left  . TOTAL HIP ARTHROPLASTY  aug 97   right hip  . TOTAL KNEE ARTHROPLASTY  1998   left    reports that she has never smoked. She has never used smokeless tobacco. She reports that she does not drink alcohol or use drugs. Social History   Social History  . Marital status: Married    Spouse name: N/A  . Number of children: N/A  . Years of education: N/A   Occupational History  . Not on file.   Social History Main Topics  . Smoking status: Never Smoker  . Smokeless tobacco:  Never Used  . Alcohol use No  . Drug use: No  . Sexual activity: No   Other Topics Concern  . Not on file   Social History Narrative  . No narrative on file    Functional Status Survey:    History reviewed. No pertinent family history.  There are no preventive care reminders to display for this patient.  Allergies  Allergen Reactions  . Adult Aspirin Ec [Aspirin] Nausea Only  . Codeine Nausea Only  . Hydrocodone Hives     Current Outpatient Prescriptions on File Prior to Visit  Medication Sig Dispense Refill  . acetaminophen (TYLENOL) 500 MG tablet Take 500 mg by mouth every 6 (six) hours as needed.    Marland Kitchen acidophilus (RISAQUAD) CAPS capsule Take 1 capsule by mouth daily.    Marland Kitchen aspirin 81 MG  tablet Take 1 tablet (81 mg total) by mouth 2 (two) times daily. 30 tablet   . Calcium Carbonate-Vitamin D (CALTRATE 600+D) 600-400 MG-UNIT per tablet Take 1 tablet by mouth daily.    . cholecalciferol (VITAMIN D) 1000 UNITS tablet Take 1,000 Units by mouth daily.    . ferrous sulfate 325 (65 FE) MG EC tablet Take 325 mg by mouth daily with breakfast.    . fish oil-omega-3 fatty acids 1000 MG capsule Take 1 g by mouth 3 (three) times daily.    . folic acid (FOLVITE) 1 MG tablet Take 1 mg by mouth daily.    Marland Kitchen lisinopril (PRINIVIL,ZESTRIL) 5 MG tablet Take 5 mg by mouth daily.    . Multiple Vitamin (MULITIVITAMIN WITH MINERALS) TABS Take 1 tablet by mouth daily.    . predniSONE (DELTASONE) 5 MG tablet Take 10 mg by mouth daily.    Marland Kitchen sulfamethoxazole-trimethoprim (BACTRIM DS,SEPTRA DS) 800-160 MG tablet Take 1 tablet by mouth 2 (two) times daily. 28 tablet 0  . traMADol (ULTRAM) 50 MG tablet Take 1 tablet (50 mg total) by mouth every 6 (six) hours as needed for pain. 20 tablet 0   No current facility-administered medications on file prior to visit.      Review of Systems  Constitutional: Positive for activity change, appetite change, fatigue and unexpected weight change. Negative for chills and fever.  HENT: Negative.   Respiratory: Negative.   Cardiovascular: Negative.   Gastrointestinal: Negative.   Genitourinary: Negative for dysuria, frequency and urgency.  Musculoskeletal: Positive for back pain, joint swelling, neck pain and neck stiffness.  Skin: Positive for color change, rash and wound.  Neurological: Positive for weakness. Negative for dizziness, seizures, syncope and speech difficulty.  Psychiatric/Behavioral: Negative.     Vitals:   10/09/16 1127  BP: (!) 166/62  Pulse: 86  Resp: 20  Temp: 97.7 F (36.5 C)  TempSrc: Oral  SpO2: 98%   There is no height or weight on file to calculate BMI. Physical Exam  Constitutional: She appears cachectic.  HENT:  Head:  Normocephalic.  Eyes: Pupils are equal, round, and reactive to light.  Neck: Neck rigidity present.  Cardiovascular: Normal rate, regular rhythm and normal heart sounds.   No murmur heard. Pulmonary/Chest: Effort normal. No respiratory distress. She has wheezes.  Abdominal: Soft. Bowel sounds are normal. There is no tenderness.  Musculoskeletal: She exhibits no edema.  Lymphadenopathy:    She has no cervical adenopathy.  Neurological: She is alert.  Skin:  Wound looked with the Nurse.  Patient had 6-8 Cm wound in her mid upper back. With Drainage and eschar and necrosis. Patient also has redness  in and around the wound. She also has stage 2 Pressure ulcer near her Sacrum. Also have stage 1 on her both heels. Have breakdown of her skin near her neck where she keeps her head tilted.A wound on her left ear.  Psychiatric: She has a normal mood and affect. Her behavior is normal.    Labs reviewed: Basic Metabolic Panel:  Recent Labs  65/68/12 2020 10/03/16 0414 10/05/16 0633 10/09/16 0830  NA  --  135 137 139  K  --  4.7 4.3 4.0  CL  --  104 103 99*  CO2  --  25 28 28   GLUCOSE  --  124* 100* 58*  BUN  --  13 18 11   CREATININE  --  0.47 0.41* 0.43*  CALCIUM  --  10.0 10.0 9.1  MG 1.2*  --   --  1.9   Liver Function Tests:  Recent Labs  10/02/16 1104 10/02/16 1735  AST 22 16  ALT 11* 10*  ALKPHOS 114 101  BILITOT 0.6 0.7  PROT 5.7* 4.8*  ALBUMIN 2.1* 1.8*   No results for input(s): LIPASE, AMYLASE in the last 8760 hours. No results for input(s): AMMONIA in the last 8760 hours. CBC:  Recent Labs  10/02/16 1104 10/02/16 1735  10/06/16 0401 10/07/16 0625 10/09/16 0830  WBC 34.5* 36.4*  < > 25.3* 24.8* 25.7*  NEUTROABS 29.4* 34.0*  --   --   --  20.8*  HGB 14.4 12.8  < > 11.2* 11.7* 14.0  HCT 41.2 36.3  < > 33.1* 34.3* 42.3  MCV 103.0* 103.1*  < > 105.8* 104.9* 106.5*  PLT 554* 433*  < > 382 385 355  < > = values in this interval not displayed. Cardiac  Enzymes:  Recent Labs  10/02/16 1104  TROPONINI <0.03   BNP: Invalid input(s): POCBNP No results found for: HGBA1C Lab Results  Component Value Date   TSH 4.528 (H) 10/09/2016   No results found for: VITAMINB12 No results found for: FOLATE No results found for: IRON, TIBC, FERRITIN  Imaging and Procedures obtained prior to SNF admission: No results found.  Assessment/Plan Patient With Rheumatoid arthritis and now non healing infected necrotic wound. Her WBC is now again elevated at 25000. Her albumin in the Hospital was 1.8 Had family meeting with the husband and her sisters. Patient had wish for no heroic measures before. Patient is going to be Comfort measures only. Will continue Antibiotics and dressing change. Pain Control. Continue all her meds per family wishes. Hospice consult. Will not do anymore blood work right now.  Will start her on Hydrocodone and Ativan as needed.  Family/ staff Communication:   Labs/tests ordered: Total time spent in this patient care encounter was 60_ minutes; greater than 50% of the visit spent counseling patient and coordinating care for problems addressed at this encounter.

## 2016-10-11 ENCOUNTER — Encounter (HOSPITAL_COMMUNITY): Payer: Self-pay | Admitting: Emergency Medicine

## 2016-10-11 ENCOUNTER — Emergency Department (HOSPITAL_COMMUNITY)
Admission: EM | Admit: 2016-10-11 | Discharge: 2016-10-11 | Disposition: A | Discharge status: Home / Self Care | Payer: PPO | Attending: Emergency Medicine | Admitting: Emergency Medicine

## 2016-10-11 ENCOUNTER — Non-Acute Institutional Stay (SKILLED_NURSING_FACILITY): Payer: PPO | Admitting: Internal Medicine

## 2016-10-11 DIAGNOSIS — L02212 Cutaneous abscess of back [any part, except buttock]: Secondary | ICD-10-CM | POA: Diagnosis not present

## 2016-10-11 DIAGNOSIS — Z7982 Long term (current) use of aspirin: Secondary | ICD-10-CM | POA: Diagnosis not present

## 2016-10-11 DIAGNOSIS — L03312 Cellulitis of back [any part except buttock]: Secondary | ICD-10-CM | POA: Diagnosis not present

## 2016-10-11 DIAGNOSIS — D72829 Elevated white blood cell count, unspecified: Secondary | ICD-10-CM | POA: Diagnosis not present

## 2016-10-11 DIAGNOSIS — L0390 Cellulitis, unspecified: Secondary | ICD-10-CM

## 2016-10-11 DIAGNOSIS — I10 Essential (primary) hypertension: Secondary | ICD-10-CM | POA: Diagnosis not present

## 2016-10-11 DIAGNOSIS — Z79899 Other long term (current) drug therapy: Secondary | ICD-10-CM | POA: Insufficient documentation

## 2016-10-11 DIAGNOSIS — L0231 Cutaneous abscess of buttock: Secondary | ICD-10-CM | POA: Diagnosis not present

## 2016-10-11 LAB — BASIC METABOLIC PANEL
Anion gap: 7 (ref 5–15)
BUN: 13 mg/dL (ref 6–20)
CALCIUM: 9 mg/dL (ref 8.9–10.3)
CO2: 29 mmol/L (ref 22–32)
Chloride: 95 mmol/L — ABNORMAL LOW (ref 101–111)
Creatinine, Ser: 0.46 mg/dL (ref 0.44–1.00)
GFR calc Af Amer: >60 mL/min (ref >60)
GLUCOSE: 119 mg/dL — AB (ref 65–99)
Potassium: 4.6 mmol/L (ref 3.5–5.1)
SODIUM: 131 mmol/L — AB (ref 135–145)

## 2016-10-11 LAB — CBC
HCT: 37.5 % (ref 36.0–46.0)
Hemoglobin: 12.9 g/dL (ref 12.0–15.0)
MCH: 35.7 pg — AB (ref 26.0–34.0)
MCHC: 34.4 g/dL (ref 30.0–36.0)
MCV: 103.9 fL — ABNORMAL HIGH (ref 78.0–100.0)
PLATELETS: 395 10*3/uL (ref 150–400)
RBC: 3.61 MIL/uL — ABNORMAL LOW (ref 3.87–5.11)
RDW: 13.1 % (ref 11.5–15.5)
WBC: 27.7 10*3/uL — AB (ref 4.0–10.5)

## 2016-10-11 MED ORDER — SODIUM CHLORIDE 0.9 % IV BOLUS (SEPSIS)
500.0000 mL | Freq: Once | INTRAVENOUS | Status: AC
  Administered 2016-10-11: 500 mL via INTRAVENOUS

## 2016-10-11 MED ORDER — PIPERACILLIN-TAZOBACTAM 3.375 G IVPB 30 MIN
3.3750 g | Freq: Once | INTRAVENOUS | Status: AC
  Administered 2016-10-11: 3.375 g via INTRAVENOUS
  Filled 2016-10-11: qty 50

## 2016-10-11 MED ORDER — PIPERACILLIN-TAZOBACTAM 3.375 G IVPB
INTRAVENOUS | Status: AC
  Filled 2016-10-11: qty 100

## 2016-10-11 NOTE — ED Triage Notes (Signed)
Nursing Staff from [facility] sent pt to ED today d/t abscess to center of back not healing from po Bactrim use. Dr. Selena Batten came to facility to see the pt last night and was suggesting that the facility should start her on IV antibiotics to be more effective and for nursing to have their PA (Arlow) to add new orders if possible. The PA gave new order to send to ED today d/t pt family had signed a MOST form upon admission at SNF that stated no IV fluids. The husband present at bedside at this time and states he wants aggressive care done for his wife and she would require another PICC line placed to get the IV antibiotics administered. PT is DNR status.

## 2016-10-11 NOTE — Discharge Instructions (Signed)
Continue the abx through the IV at the nursing facility, we will leave the IV in place, they may need to arrange for a PICC line next week

## 2016-10-11 NOTE — ED Notes (Signed)
Report given to Renee (nurse at [facility] for discharge instructions) PT is to leave with saline lock in place.

## 2016-10-11 NOTE — ED Provider Notes (Signed)
[facility] DEPT Provider Note   CSN: [id number] Arrival date & time: 10/11/16  1420     History   Chief Complaint Chief Complaint  Patient presents with  . Abscess    HPI Alyssa Coleman is a 76 y.o. female.  HPI Patient presents to the emergency room to receive IV antibiotics. Patient's had a complicated medical history. Patient has long-standing rheumatoid arthritis.  Over the last 3 months she's had progressive worsening weakness. According to family members, she has not been able to move out of the living room recliner. Family members were taking care of her at home until just earlier this month on February 8 when she was brought to the emergency room. In the ED the patient was found have many decubitus wounds on her back. She was treated in the emergency room for sepsis and soft tissue infections. She had debridement of the wound on her back.  She was ultimately discharged her nursing care facility. There are notes in the computer from the nursing home on February 15.  At that time, the family decided on no heroic measures. They were planning on comfort measures and were going to continue antibiotics and dressing changes.  According to the husband, Dr. Selena Batten evaluated the patient in the nursing facility last evening and he recommended that she receive IV antibiotics.  The patient was sent this morning from the nursing facility to the emergency room to receive IV antibiotics. Patient denies any significant change in her symptoms. She has not had any recent fevers. Diarrhea. Past Medical History:  Diagnosis Date  . Angina   . Anxiety   . Arthritis   . Blood transfusion   . CAP (community acquired pneumonia) 10/02/2016  . GERD (gastroesophageal reflux disease)   . Hypertension   . Myocardial infarction   . PONV (postoperative nausea and vomiting)   . Shortness of breath   . Spinal headache     Patient Active Problem List   Diagnosis Date Noted  . Palliative care encounter   .  Goals of care, counseling/discussion   . Sepsis (HCC) 10/02/2016  . Community acquired pneumonia 10/02/2016  . Back abscess 10/02/2016  . 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 CARDIAC CATHETERIZATION    . HAND SURGERY  1980   skin infection  . JOINT REPLACEMENT    . ORIF PERIPROSTHETIC FRACTURE  09/17/2011   Procedure: OPEN REDUCTION INTERNAL FIXATION (ORIF) PERIPROSTHETIC FRACTURE;  Surgeon: Shelda Pal, MD;  Location: [facility];  Service: Orthopedics;  Laterality: Left;  . TOTAL HIP ARTHROPLASTY  Feb 97   left  . TOTAL HIP ARTHROPLASTY  aug 97   right hip  . TOTAL KNEE ARTHROPLASTY  1998   left    OB History    No data available       Home Medications    Prior to Admission medications   Medication Sig Start Date End Date Taking? Authorizing Provider  acetaminophen (TYLENOL) 500 MG tablet Take 500 mg by mouth every 6 (six) hours as needed.    Historical Provider, MD  acidophilus (RISAQUAD) CAPS capsule Take 1 capsule by mouth daily.    Historical Provider, MD  aspirin 81 MG tablet Take 1 tablet (81 mg total) by mouth 2 (two) times daily. 09/19/11   Lanney Gins, PA-C  Calcium Carbonate-Vitamin D (CALTRATE 600+D) 600-400 MG-UNIT per tablet Take 1 tablet by mouth daily.  Historical Provider, MD  cholecalciferol (VITAMIN D) 1000 UNITS tablet Take 1,000 Units by mouth daily.    Historical Provider, MD  collagenase (SANTYL) ointment Apply one application per treatment orders    Historical Provider, MD  ferrous sulfate 325 (65 FE) MG EC tablet Take 325 mg by mouth daily with breakfast.    Historical Provider, MD  fish oil-omega-3 fatty acids 1000 MG capsule Take 1 g by mouth 3 (three) times daily.    Historical Provider, MD  folic acid (FOLVITE) 1 MG tablet Take 1 mg by mouth daily.    Historical Provider, MD  lisinopril (PRINIVIL,ZESTRIL) 5 MG tablet Take 5 mg by mouth daily.     Historical Provider, MD  Multiple Vitamin (MULITIVITAMIN WITH MINERALS) TABS Take 1 tablet by mouth daily.    Historical Provider, MD  nystatin (NYSTATIN) powder Apply to fungal rash on neck and back every shift    Historical Provider, MD  predniSONE (DELTASONE) 5 MG tablet Take 10 mg by mouth daily. 02/23/13   Historical Provider, MD  sulfamethoxazole-trimethoprim (BACTRIM DS,SEPTRA DS) 800-160 MG tablet Take 1 tablet by mouth 2 (two) times daily. 10/07/16   Henderson Cloud, MD  thyroid (ARMOUR) 90 MG tablet Take 90 mg by mouth daily.    Historical Provider, MD  traMADol (ULTRAM) 50 MG tablet Take 1 tablet (50 mg total) by mouth every 6 (six) hours as needed for pain. 04/19/13   Dione Booze, MD    Family History History reviewed. No pertinent family history.  Social History Social History  Substance Use Topics  . Smoking status: Never Smoker  . Smokeless tobacco: Never Used  . Alcohol use No     Allergies   Adult aspirin ec [aspirin]; Codeine; Hydrocodone; and Sulfa antibiotics   Review of Systems Review of Systems  Constitutional: Negative for fever.  HENT: Negative for rhinorrhea.   Gastrointestinal: Negative for diarrhea and vomiting.  Genitourinary: Negative for dysuria.       Foley catheter in place   Neurological: Negative for seizures and syncope.  Psychiatric/Behavioral: Negative for behavioral problems.  All other systems reviewed and are negative.    Physical Exam Updated Vital Signs BP 127/55   Pulse 94   Temp 98.1 F (36.7 C) (Oral)   Resp 17   Ht 5\' 7"  (1.702 m)   Wt 44.9 kg   SpO2 94%   BMI 15.51 kg/m   Physical Exam  Constitutional: No distress.  Frail, elderly   HENT:  Head: Normocephalic and atraumatic.  Right Ear: External ear normal.  Left Ear: External ear normal.  Eyes: Conjunctivae are normal. Right eye exhibits no discharge. Left eye exhibits no discharge. No scleral icterus.  Neck: No tracheal deviation present.  Neck  contracted to the left   Cardiovascular: Normal rate, regular rhythm and intact distal pulses.   Pulmonary/Chest: Effort normal and breath sounds normal. No stridor. No respiratory distress. She has no wheezes. She has no rales.  Abdominal: Soft. Bowel sounds are normal. She exhibits no distension. There is no tenderness. There is no rebound and no guarding.  Genitourinary:  Genitourinary Comments: Indwelling foley catheter, cloudy yellow urine  Musculoskeletal: She exhibits edema and tenderness.  Edema bilateral lower extermities, area of edema and erythema with mild tenderness soft tissue of right upper arm proximate to the elbow; atrophy all extremities, contractures, rheumatoid changes in extremities  Neurological: She is alert. She displays atrophy. No cranial nerve deficit (no facial droop, extraocular movements intact, no slurred speech)  or sensory deficit. She exhibits normal muscle tone. She displays no seizure activity.  General weakness  Skin: Skin is warm and dry. No rash noted. She is not diaphoretic.  Erythema on sacrum without a wound, eschar and healing wound on back without purulent drainage  Psychiatric: She has a normal mood and affect.  Nursing note and vitals reviewed.    ED Treatments / Results  Labs (all labs ordered are listed, but only abnormal results are displayed) Labs Reviewed  CBC - Abnormal; Notable for the following:       Result Value   WBC 27.7 (*)    RBC 3.61 (*)    MCV 103.9 (*)    MCH 35.7 (*)    All other components within normal limits  BASIC METABOLIC PANEL - Abnormal; Notable for the following:    Sodium 131 (*)    Chloride 95 (*)    Glucose, Bld 119 (*)    All other components within normal limits    Radiology No results found.  Procedures Procedures (including critical care time)  Medications Ordered in ED Medications  piperacillin-tazobactam (ZOSYN) IVPB 3.375 g (not administered)  sodium chloride 0.9 % bolus 500 mL (500 mLs  Intravenous New Bag/Given 10/11/16 1536)     Initial Impression / Assessment and Plan / ED Course  I have reviewed the triage vital signs and the nursing notes.  Pertinent labs & imaging results that were available during my care of the patient were reviewed by me and considered in my medical decision making (see chart for details).   the patient's labs are not significantly changed from laboratory tests done just 2 days ago. She continues to have a very elevated white blood cell count in the 20s. This may be related to a combination of the infection and her chronic steroid use.  Her platelets are otherwise reassuring with exception of hyponatremia that he cannot think is clinically significant. She was given a dose of Zosyn in the emergency room. Will be discharged with the IV. I spoke with  PA Arlo from [facility] who is covering for the weekend.  They can continue abx at the facility.   Final Clinical Impressions(s) / ED Diagnoses   Final diagnoses:  Cellulitis, unspecified cellulitis site  Leukocytosis, unspecified type      Linwood Dibbles, MD 10/11/16 1704

## 2016-10-11 NOTE — ED Notes (Signed)
[facility] here to bring pt back to facility.  Paperwork that was sent with pt was placed back in packet and given to staff . Discharge paperwork also given to staff.  Pt alert, oriented and stable at discharge.

## 2016-10-11 NOTE — ED Notes (Signed)
Pt made aware to return if symptoms worsen or if any life threatening symptoms occur.   

## 2016-10-12 NOTE — Progress Notes (Signed)
This is an acute visit.  Level care skilled.  Facility is [facility].  Chief complaint-acute visit secondary to continued elevated high white count and concern patient may need IV antibiotics  History of present illness.  Patient is a pleasant 76 year old female seen today for concerns of an elevated white count some concern for the need for an IV antibiotic. Patient has a long-standing history of significant rheumatoid arthritis and was living at home with her husband Patient developed 2 rounds size wounds on her back at home that apparently deteriorated fairly rapidly.  The wounds were debrided in the ED-it drained purulent fluid.  Surgery did not think her wound to be debrided anymore.  She was treated aggressively with IV antibiotics in the hospital--and discharged on by mouth Bactrim-with recommendation for palliative care follow-up secondary to patient's challenging prognosis.   Earlier this week Dr. Chales Abrahams did meet with the family and  there were desires express for more comfort care-orders were to continue the by mouth antibiotic and put emphasis on pain control continue current meds for now but-but pursue a hospice consult.   Most form apparently was filled out.  Her white count has been elevated most recent 25,700 on February 15 it had been as high as 31,100 approximately a week L  Apparently yesterday patient's primary care physician did pay a visit-and there was recommendation that she would benefit from IV antibiotics secondary to her high white count-and the family would like this to be pursued-I did speak with her husband who has now expressed desires for having IV antibiotics- She continues to be in a guarded frail status vital signs actually appear to be stable family thinks that she has had some gradual decline over the past several days.  Past Medical History:  Diagnosis Date  . Angina   . Anxiety   . Arthritis   . Blood transfusion   . CAP (community  acquired pneumonia) 10/02/2016  . GERD (gastroesophageal reflux disease)   . Hypertension   . Myocardial infarction   . PONV (postoperative nausea and vomiting)   . Shortness of breath   . Spinal headache         Past Surgical History:  Procedure Laterality Date  . CARDIAC CATHETERIZATION    . HAND SURGERY  1980   skin infection  . JOINT REPLACEMENT    . ORIF PERIPROSTHETIC FRACTURE  09/17/2011   Procedure: OPEN REDUCTION INTERNAL FIXATION (ORIF) PERIPROSTHETIC FRACTURE;  Surgeon: Shelda Pal, MD;  Location: [facility];  Service: Orthopedics;  Laterality: Left;  . TOTAL HIP ARTHROPLASTY  Feb 97   left  . TOTAL HIP ARTHROPLASTY  aug 97   right hip  . TOTAL KNEE ARTHROPLASTY  1998   left    reports that she has never smoked. She has never used smokeless tobacco. She reports that she does not drink alcohol or use drugs. Social History   Social History  . Marital status: Married    Spouse name: N/A  . Number of children: N/A  . Years of education: N/A      Occupational History  . Not on file.       Social History Main Topics  . Smoking status: Never Smoker  . Smokeless tobacco: Never Used  . Alcohol use No  . Drug use: No  . Sexual activity: No       Other Topics Concern  . Not on file      Social History Narrative  . No narrative on  file    Functional Status Survey:  History reviewed. No pertinent family history.  There are no preventive care reminders to display for this patient.      Allergies  Allergen Reactions  . Adult Aspirin Ec [Aspirin] Nausea Only  . Codeine Nausea Only  . Hydrocodone Hives           Current Outpatient Prescriptions on File Prior to Visit  Medication Sig Dispense Refill  . acetaminophen (TYLENOL) 500 MG tablet Take 500 mg by mouth every 6 (six) hours as needed.    Marland Kitchen acidophilus (RISAQUAD) CAPS capsule Take 1 capsule by mouth daily.    Marland Kitchen aspirin 81 MG tablet Take 1 tablet (81 mg total)  by mouth 2 (two) times daily. 30 tablet   . Calcium Carbonate-Vitamin D (CALTRATE 600+D) 600-400 MG-UNIT per tablet Take 1 tablet by mouth daily.    . cholecalciferol (VITAMIN D) 1000 UNITS tablet Take 1,000 Units by mouth daily.    . ferrous sulfate 325 (65 FE) MG EC tablet Take 325 mg by mouth daily with breakfast.    . fish oil-omega-3 fatty acids 1000 MG capsule Take 1 g by mouth 3 (three) times daily.    . folic acid (FOLVITE) 1 MG tablet Take 1 mg by mouth daily.    Marland Kitchen lisinopril (PRINIVIL,ZESTRIL) 5 MG tablet Take 5 mg by mouth daily.    . Multiple Vitamin (MULITIVITAMIN WITH MINERALS) TABS Take 1 tablet by mouth daily.    . predniSONE (DELTASONE) 5 MG tablet Take 10 mg by mouth daily.    Marland Kitchen sulfamethoxazole-trimethoprim (BACTRIM DS,SEPTRA DS) 800-160 MG tablet Take 1 tablet by mouth 2 (two) times daily. 28 tablet 0  . traMADol (ULTRAM) 50 MG tablet Take 1 tablet (50 mg total) by mouth every 6 (six) hours as needed for pain. 20 tablet 0   No current facility-administered medications on file prior to visit.      Review of Systems  Constitutional: Positive for activity change, appetite change, fatigue and unexpected weight change. Negative for chills and fever.  HENT: Negative.  for visual changes Respiratory: Negative.  Does not complain of shortness breath or cough Cardiovascular: Negative. Does not complain of chest pain   Gastrointestinal: Negative.   for acute abdominal discomfort although when palpated. Have some abdominal discomfort does not complain of nausea or vomiting Genitourinary: Negative for dysuria, frequency and urgency.  Musculoskeletal: Positive for back pain, joint swelling, neck pain and neck stiffness.  Skin: Positive for color change, rash and wound.  Neurological: Positive for weakness. Which she and family feels is progressing Negative for dizziness, seizures, syncope and speech difficulty.  Psychiatric/Behavioral: Negative.       Temperature 98.1 pulse 89 respirations 20 blood pressure 146/80 Physical Exam  Constitutional: She appears cachectic. But in no distress HENT:  Head: Normocephalic.  Eyes: Pupils are equal, round, and reactive to light.  Neck: Neck rigidity present.  Cardiovascular: Normal rate, regular rhythm and normal heart sounds.   No murmur heard. Pulmonary/Chest: Effort normal. No respiratory distress. Shallow air entry Abdominal: Soft. Bowel sounds are normal. She does have some diffuse tenderness to palpation this may be somewhat attributable to the invasive maneuver rather than true acute tenderness  Musculoskeletal: She exhibits some bilateral lower extremity edema-. Arthritic changes diffuse-she does have protective boots on bilaterally  Lymphadenopathy:    She has no cervical adenopathy.  Neurological: She is alert.  Skin:   Her wounds are currently covered-most prominent wound is the upper back-there is  some tenderness to the area.  I do not see any active drainage from what I was able to assess  Psychiatric: She has a normal mood and affect. Her behavior is normal.    Labs reviewed: Basic Metabolic Panel:  [id number])   Recent Labs  10/02/16 2020 10/03/16 0414 10/05/16 0633 10/09/16 0830  NA  --  135 137 139  K  --  4.7 4.3 4.0  CL  --  104 103 99*  CO2  --  25 28 28   GLUCOSE  --  124* 100* 58*  BUN  --  13 18 11   CREATININE  --  0.47 0.41* 0.43*  CALCIUM  --  10.0 10.0 9.1  MG 1.2*  --   --  1.9     Liver Function Tests:  RecentLabs(withinlast365days)   Recent Labs  10/02/16 1104 10/02/16 1735  AST 22 16  ALT 11* 10*  ALKPHOS 114 101  BILITOT 0.6 0.7  PROT 5.7* 4.8*  ALBUMIN 2.1* 1.8*     RecentLabs(withinlast365days)  No results for input(s): LIPASE, AMYLASE in the last 8760 hours.   RecentLabs(withinlast365days)  No results for input(s): AMMONIA in the last 8760 hours.    CBC:  RecentLabs(withinlast365days)   Recent Labs  10/02/16 1104 10/02/16 1735  10/06/16 0401 10/07/16 0625 10/09/16 0830  WBC 34.5* 36.4*  < > 25.3* 24.8* 25.7*  NEUTROABS 29.4* 34.0*  --   --   --  20.8*  HGB 14.4 12.8  < > 11.2* 11.7* 14.0  HCT 41.2 36.3  < > 33.1* 34.3* 42.3  MCV 103.0* 103.1*  < > 105.8* 104.9* 106.5*  PLT 554* 433*  < > 382 385 355  < > = values in this interval not displayed.   Cardiac Enzymes:  RecentLabs(withinlast365days)   Recent Labs  10/02/16 1104  TROPONINI <0.03     BNP: RecentLabs(withinlast365days)  Invalid input(s): POCBNP   RecentLabs  No results found for: HGBA1C   RecentLabs       Lab Results  Component Value Date   TSH 4.528 (H) 10/09/2016     RecentLabs  No results found for: VITAMINB12   RecentLabs  No results found for: FOLATE   RecentLabs  No results found for: IRON, TIBC, FERRITIN    Imaging and Procedures obtained prior to SNF admission: No results found.   Assessment plan.  #1--history of rheumatoid arthritis with infected necrotic wound-white count again continues to be elevated-family now wishes to have more aggressive interventions and  will send her to the ER I suspect to start an IV antibiotic-will await their assessment I suspect they will do updated labs as well-at this point continue all of her medications-and await ER hospital assessment-she does not appear to be in any acute distress although again is in a very guarded condition.  Addendum.  I did speak with the emergency room physician Dr. Lew Dawes have started an IV antibiotic Zosyn-with follow-up to be done here at the facility-he also did do lab work which showed relative stability white count continues to be elevated at 27,700-platelets are stable at 395,000.  Metabolic panel showed hyponatremia at 131 otherwise showed essential stability.  Again patient will be returning to facility will be receiving IV  Zosyn will dose this for every 6 hours for now and await Dr. Urban Gibson input on Monday when she is in the facility.   [id number] note greater than 35 minutes spent assessing patient-reviewing her chart-reviewing her labs-discussing her status with family at bedside as well as  with nursing staff-as well as with the ER physician-iand coordinating and formulating a plan of care-of note greater than 50% of time spent coordinating a plan of care with input as noted above

## 2016-10-13 ENCOUNTER — Encounter: Payer: Self-pay | Admitting: Internal Medicine

## 2016-10-20 ENCOUNTER — Non-Acute Institutional Stay (SKILLED_NURSING_FACILITY): Payer: PPO | Admitting: Internal Medicine

## 2016-10-20 ENCOUNTER — Encounter: Payer: Self-pay | Admitting: Internal Medicine

## 2016-10-20 DIAGNOSIS — R0902 Hypoxemia: Secondary | ICD-10-CM

## 2016-10-20 NOTE — Progress Notes (Signed)
Location:   [facility] Room Number: 130/P Place of Service:  SNF (31) Provider:  Lucretia Field, MD  Patient Care Team: Jani Gravel, MD as PCP - General (Internal Medicine)  Extended Emergency Contact Information Primary Emergency Contact: Noah Charon Address: [address]          [place], [place] [address] [place] Phone: [phone] Relation: Spouse  Code Status:  DNR Goals of care: Advanced Directive information Advanced Directives 10/20/2016  Does Patient Have a Medical Advance Directive? Yes  Type of Advance Directive Out of facility DNR (pink MOST or yellow form)  Does patient want to make changes to medical advance directive? No - Patient declined  Would patient like information on creating a medical advance directive? No - Patient declined  Pre-existing out of facility DNR order (yellow form or pink MOST form) -    Acute visit-secondary to transitory hypoxia   HPI:  Pt is a 76 y.o. female seen today for an acute visit for transitory hypoxia.  She has a complicated medical history including significant back wounds that have been evaluated by surgery and thought not suitable for debridement.  At one point she was treated aggressively with IV antibiotics in the hospital and discharged on by mouth Bactrim with recommendation for palliative care follow-up secondary to her poor prognosis.  However on February 17 she was sent to the ER secondary to elevated white count and suggestion  of IV antibiotics after patient was visited by her primary care provider.  However subsequently she has returned in the facility and IV antibiotics were stopped with emphasis again on comfort care--with no hospitalization unless comfort cares cannot be met in facility.  She is slated to be under hospice care once she is discharged from the facility  Apparently this morning she did have transitory hypoxia with oxygen saturation in the 70s  however with oxygen quickly rose into the 90s.  Currently she is not complaining of shortness of breath or discomfort she appears to be very frail weak but resting comfortably vital signs are stable         Past Medical History:  Diagnosis Date  . Angina   . Anxiety   . Arthritis   . Blood transfusion   . CAP (community acquired pneumonia) 10/02/2016  . GERD (gastroesophageal reflux disease)   . Hypertension   . Myocardial infarction   . PONV (postoperative nausea and vomiting)   . Shortness of breath   . Spinal headache    Past Surgical History:  Procedure Laterality Date  . CARDIAC CATHETERIZATION    . HAND SURGERY  1980   skin infection  . JOINT REPLACEMENT    . ORIF PERIPROSTHETIC FRACTURE  09/17/2011   Procedure: OPEN REDUCTION INTERNAL FIXATION (ORIF) PERIPROSTHETIC FRACTURE;  Surgeon: Mauri Pole, MD;  Location: [facility];  Service: Orthopedics;  Laterality: Left;  . TOTAL HIP ARTHROPLASTY  Feb 97   left  . TOTAL HIP ARTHROPLASTY  aug 97   right hip  . TOTAL KNEE ARTHROPLASTY  1998   left    Allergies  Allergen Reactions  . Adult Aspirin Ec [Aspirin] Nausea Only  . Codeine Nausea Only  . Hydrocodone Hives  . Sulfa Antibiotics Other (See Comments)    unknown    Allergies as of 10/20/2016      Reactions   Adult Aspirin Ec [aspirin] Nausea Only   Codeine Nausea Only   Hydrocodone Hives   Sulfa  Antibiotics Other (See Comments)   unknown      Medication List       Accurate as of 10/20/16 12:33 PM. Always use your most recent med list.          acetaminophen 500 MG tablet Commonly known as:  TYLENOL Take 500 mg by mouth every 6 (six) hours as needed.   aspirin 81 MG tablet Take 1 tablet (81 mg total) by mouth 2 (two) times daily.   CALTRATE 600+D 600-400 MG-UNIT tablet Generic drug:  Calcium Carbonate-Vitamin D Take 1 tablet by mouth daily.   cholecalciferol 1000 units tablet Commonly known as:  VITAMIN D Take 1,000 Units by mouth daily.     ferrous sulfate 325 (65 FE) MG EC tablet Take 325 mg by mouth daily with breakfast.   fish oil-omega-3 fatty acids 1000 MG capsule Take 1 g by mouth 3 (three) times daily.   folic acid 1 MG tablet Commonly known as:  FOLVITE Take 1 mg by mouth daily.   lisinopril 5 MG tablet Commonly known as:  PRINIVIL,ZESTRIL Take 5 mg by mouth daily.   LORazepam 0.5 MG tablet Commonly known as:  ATIVAN Take 0.5 mg by mouth every 6 (six) hours as needed for anxiety.   multivitamin with minerals Tabs tablet Take 1 tablet by mouth daily.   nystatin powder Generic drug:  nystatin Apply to fungal rash on neck and back every shift   predniSONE 5 MG tablet Commonly known as:  DELTASONE Take 5 mg by mouth daily.   PRO-STAT 101 PO Take three times a day   RISA-BID PROBIOTIC PO Give one tablet by mouth daily   SANTYL ointment Generic drug:  collagenase Apply one application per treatment orders   thyroid 90 MG tablet Commonly known as:  ARMOUR Take 90 mg by mouth daily.   traMADol 50 MG tablet Commonly known as:  ULTRAM Take 1 tablet (50 mg total) by mouth every 6 (six) hours as needed for pain.       Review of Systems  At this point does not complain of fever chills is very weak.  Skin is not complaining of rashes or itching does have significant back wounds again not thought to be a candidate for aggressive treatment surgical debridement.  Head ears eyes nose mouth and throat does not complaining of visual changes or sore throat.  Respiratory is not complaining of increased shortness of breath or cough.  Cardiac does not complaining of chest pain.  He I does not complain of abdominal pain at this time.  Musculoskeletal does have a significant history rheumatoid arthritis says she's not hurting less certain areas are palpated like her hands.  Neurologic is not complaining of dizziness headache or syncope.  Psych does not complain of overt anxiety    There is no  immunization history on file for this patient. Pertinent  Health Maintenance Due  Topic Date Due  . MAMMOGRAM  06/24/1993  . COLONOSCOPY  06/24/1993  . DEXA SCAN  06/24/2008  . INFLUENZA VACCINE  07/25/2017 (Originally 03/25/2016)  . PNA vac Low Risk Adult (1 of 2 - PCV13) 07/25/2017 (Originally 06/24/2008)   No flowsheet data found. Functional Status Survey:   Temperature is 97.2 pulse is 102 respirations 20 blood pressure 138/85.     Physical Exam In general this is a very frail elderly female who does not appear to be in any distress she is resting in bed comfortably but is very weak.  Her skin is warm  and dry she appears to have some chronic bruising. Wounds are currently covered on her back  Chest is clear to auscultation with no labored breathing somewhat shallow air entry--no sign of distress.  Heart is  borderline Tachycardic I got between 98 and 104 on exam.  She did not appear to have significant lower extremity edema.  Abdomen was soft. To be nontender there are positive bowel sounds.  Musculoskeletal does have arthritic changes that are diffuse her hands have  tenderness to palpation this does not appear to be new however  Neurologic is grossly intact she is alert.  Psych she is pleasant and appropriate and alert   Labs reviewed:  Recent Labs  10/02/16 2020  10/05/16 0633 10/09/16 0830 10/11/16 1518  NA  --   < > 137 139 131*  K  --   < > 4.3 4.0 4.6  CL  --   < > 103 99* 95*  CO2  --   < > 28 28 29   GLUCOSE  --   < > 100* 58* 119*  BUN  --   < > 18 11 13   CREATININE  --   < > 0.41* 0.43* 0.46  CALCIUM  --   < > 10.0 9.1 9.0  MG 1.2*  --   --  1.9  --   < > = values in this interval not displayed.  Recent Labs  10/02/16 1104 10/02/16 1735  AST 22 16  ALT 11* 10*  ALKPHOS 114 101  BILITOT 0.6 0.7  PROT 5.7* 4.8*  ALBUMIN 2.1* 1.8*    Recent Labs  10/02/16 1104 10/02/16 1735  10/07/16 0625 10/09/16 0830 10/11/16 1518  WBC 34.5*  36.4*  < > 24.8* 25.7* 27.7*  NEUTROABS 29.4* 34.0*  --   --  20.8*  --   HGB 14.4 12.8  < > 11.7* 14.0 12.9  HCT 41.2 36.3  < > 34.3* 42.3 37.5  MCV 103.0* 103.1*  < > 104.9* 106.5* 103.9*  PLT 554* 433*  < > 385 355 395  < > = values in this interval not displayed. Lab Results  Component Value Date   TSH 4.528 (H) 10/09/2016   No results found for: HGBA1C No results found for: CHOL, HDL, LDLCALC, LDLDIRECT, TRIG, CHOLHDL  Significant Diagnostic Results in last 30 days:  Dg Chest Portable 1 View  Result Date: 10/02/2016 CLINICAL DATA:  Weakness. EXAM: PORTABLE CHEST 1 VIEW COMPARISON:  12/21/2004 FINDINGS: Bibasilar opacities, likely atelectasis at the right base. Left basilar opacity could reflect atelectasis or infiltrate. Heart is borderline in size. No effusions or acute bony abnormality. Old healed proximal left humeral fracture. IMPRESSION: Bibasilar atelectasis or infiltrates, left greater than right. Electronically Signed   By: Rolm Baptise M.D.   On: 10/02/2016 11:29    Assessment/Plan   #1 transitory hypoxia-this appears to have resolved with oxygen clinically she appears to be in no distress emphasis again is on comfort care-at this point monitor with vital signs and pulse ox every 4 hours 2 and then every shift  If patient appears uncomfortable certainly this will have to be readdressed.  She does have Ativan as needed for anxiety.  Also tramadol as needed for pain.     She will need oxygen as needed   [phone] Of note greater than 25 minutes spent assessing patient-reviewing charts,discussing status with nursing staff and coordinating plan of care      Oralia Manis, [place]

## 2016-10-23 DIAGNOSIS — A419 Sepsis, unspecified organism: Secondary | ICD-10-CM | POA: Diagnosis not present

## 2016-10-23 DIAGNOSIS — Z96642 Presence of left artificial hip joint: Secondary | ICD-10-CM | POA: Diagnosis not present

## 2016-10-23 DIAGNOSIS — F411 Generalized anxiety disorder: Secondary | ICD-10-CM | POA: Diagnosis not present

## 2016-10-23 DIAGNOSIS — R279 Unspecified lack of coordination: Secondary | ICD-10-CM | POA: Diagnosis not present

## 2016-10-23 DIAGNOSIS — Z7952 Long term (current) use of systemic steroids: Secondary | ICD-10-CM | POA: Diagnosis not present

## 2016-10-23 DIAGNOSIS — Z96641 Presence of right artificial hip joint: Secondary | ICD-10-CM | POA: Diagnosis not present

## 2016-10-23 DIAGNOSIS — J189 Pneumonia, unspecified organism: Secondary | ICD-10-CM | POA: Diagnosis not present

## 2016-10-23 DIAGNOSIS — M6281 Muscle weakness (generalized): Secondary | ICD-10-CM | POA: Diagnosis not present

## 2016-10-23 DIAGNOSIS — I10 Essential (primary) hypertension: Secondary | ICD-10-CM | POA: Diagnosis not present

## 2016-10-23 DIAGNOSIS — K219 Gastro-esophageal reflux disease without esophagitis: Secondary | ICD-10-CM | POA: Diagnosis not present

## 2016-10-23 DIAGNOSIS — M0549 Rheumatoid myopathy with rheumatoid arthritis of multiple sites: Secondary | ICD-10-CM | POA: Diagnosis not present

## 2016-10-23 DIAGNOSIS — L02212 Cutaneous abscess of back [any part, except buttock]: Secondary | ICD-10-CM | POA: Diagnosis not present

## 2016-10-26 ENCOUNTER — Non-Acute Institutional Stay (SKILLED_NURSING_FACILITY): Payer: PPO | Admitting: Internal Medicine

## 2016-10-26 ENCOUNTER — Encounter (HOSPITAL_COMMUNITY)
Admission: RE | Admit: 2016-10-26 | Discharge: 2016-10-26 | Disposition: A | Discharge status: Home / Self Care | Payer: PPO | Source: Other Acute Inpatient Hospital | Attending: Internal Medicine | Admitting: Internal Medicine

## 2016-10-26 DIAGNOSIS — L89114 Pressure ulcer of right upper back, stage 4: Secondary | ICD-10-CM | POA: Diagnosis not present

## 2016-10-26 DIAGNOSIS — L03312 Cellulitis of back [any part except buttock]: Secondary | ICD-10-CM | POA: Diagnosis not present

## 2016-10-26 DIAGNOSIS — M06032 Rheumatoid arthritis without rheumatoid factor, left wrist: Secondary | ICD-10-CM

## 2016-10-26 DIAGNOSIS — M06031 Rheumatoid arthritis without rheumatoid factor, right wrist: Secondary | ICD-10-CM | POA: Diagnosis not present

## 2016-10-26 DIAGNOSIS — K219 Gastro-esophageal reflux disease without esophagitis: Secondary | ICD-10-CM | POA: Insufficient documentation

## 2016-10-26 DIAGNOSIS — I10 Essential (primary) hypertension: Secondary | ICD-10-CM | POA: Insufficient documentation

## 2016-10-26 DIAGNOSIS — M05722 Rheumatoid arthritis with rheumatoid factor of left elbow without organ or systems involvement: Secondary | ICD-10-CM

## 2016-10-26 DIAGNOSIS — F411 Generalized anxiety disorder: Secondary | ICD-10-CM | POA: Insufficient documentation

## 2016-10-26 NOTE — Progress Notes (Signed)
10/26/16  Facility; [facility]  Chief complaint; uncontrolled pain  History; this is a 76 year old woman who was initially admitted to hospital from 2/8 through 10/07/16 with an abscess in her back. She had an I&D done in the emergency room which showed 60 ML's of purulent material. The culture I review show Proteus although I don't see the sensitivities of this. 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 She is complaining of widespread pain which is difficult to localize. Her back is certainly one of these areas where the wound is however I wonder if there is still some activity to her rheumatoid  One of the nurses stop me in the hall today to report unrelenting pain was not relieved by tramadol. I note she is allergic to hydrocodone  CBC Latest Ref Rng & Units 10/11/2016 10/09/2016 10/07/2016  WBC 4.0 - 10.5 K/uL 27.7(H) 25.7(H) 24.8(H)  Hemoglobin 12.0 - 15.0 g/dL 12.9 14.0 11.7(L)  Hematocrit 36.0 - 46.0 % 37.5 42.3 34.3(L)  Platelets 150 - 400 K/uL 395 355 385    Past Medical History:  Diagnosis Date  . Angina   . Anxiety   . Arthritis   . Blood transfusion   . CAP (community acquired pneumonia) 10/02/2016  . GERD (gastroesophageal reflux disease)   . Hypertension   . Myocardial infarction   . PONV (postoperative nausea and vomiting)   . Shortness of breath   . Spinal headache     Past Surgical History:  Procedure Laterality Date  . CARDIAC CATHETERIZATION    . HAND SURGERY  1980   skin infection  . JOINT REPLACEMENT    . ORIF PERIPROSTHETIC FRACTURE  09/17/2011   Procedure: OPEN REDUCTION INTERNAL FIXATION (ORIF) PERIPROSTHETIC FRACTURE;  Surgeon: Mauri Pole, MD;  Location: [facility];  Service: Orthopedics;  Laterality: Left;  . TOTAL HIP ARTHROPLASTY  Feb 97   left  . TOTAL HIP ARTHROPLASTY  aug  97   right hip  . TOTAL KNEE ARTHROPLASTY  1998   left    Review of systems Gen. patient rates her pain at 8 out of 10 Respiratory no cough no sputum Cardiac no chest pain GI court[place] family not eating well although she is drinking no vomiting or diarrhea GU Foley catheter in place, not certain of the exact indication Musculoskeletal most of the pain is in her left elbow both wrists both knees and her neck as well as the actual wound  Physical examination Gen. frail woman not in any overt distress as long as she is not moved in any way at which case she yells out in pain Respiratory kyphotic. Shallow air entry no wheezing Cardiac heart sounds are normal she appears to be euvolemic Abdomen no liver no spleen no tenderness GU no suprapubic or costovertebral angle tenderness Foley catheter in place Musculoskeletal; warmth but no effusion in both knees. Exquisite tenderness in the left elbow joint line both wrists. Chronic rheumatoid deformities of her hands Skin; a roughly T5 there is 2 wounds however these are bridged. Large open area Purulent green drainage exposed bone. No surrounding soft tissue crepitus  Impression/plan #1 likely an infected wound in her upper thoracic spine. Green drainage which is been cultured. She has exposed bone that very well could be osteomyelitis. #2 although she looked like she had burned out rheumatoid  arthritis when I first came in the room, physical exam suggests active synovitis in the left elbow and both wrists. There is no effusion at any site. I wonder about a tapering course of prednisone here #3 I gather the patient is potentially going home this week with hospice. He'll need to establish whether antibiotics are part of the plan of care when this culture comes back. For today I am going to give her a tapering course of prednisone and see if that calms down her joints. I think this patient could potentially benefit from narcotics she really seems to be  terrible pain

## 2016-10-29 LAB — AEROBIC CULTURE W GRAM STAIN (SUPERFICIAL SPECIMEN)

## 2016-10-29 LAB — AEROBIC CULTURE  (SUPERFICIAL SPECIMEN)

## 2016-11-05 ENCOUNTER — Other Ambulatory Visit: Payer: Self-pay | Admitting: *Deleted

## 2016-11-05 MED ORDER — OXYCODONE HCL 5 MG/5ML PO SOLN
ORAL | 0 refills | Status: DC
Start: 2016-11-05 — End: 2016-12-09

## 2016-11-05 NOTE — Telephone Encounter (Signed)
[facility] #[phone] Fax: [phone]   

## 2016-11-09 ENCOUNTER — Encounter (HOSPITAL_COMMUNITY): Payer: Self-pay | Admitting: Cardiology

## 2016-11-09 ENCOUNTER — Emergency Department (HOSPITAL_COMMUNITY): Payer: PPO

## 2016-11-09 ENCOUNTER — Emergency Department (HOSPITAL_COMMUNITY)
Admission: EM | Admit: 2016-11-09 | Discharge: 2016-11-09 | Disposition: A | Discharge status: Home / Self Care | Payer: PPO | Attending: Emergency Medicine | Admitting: Emergency Medicine

## 2016-11-09 DIAGNOSIS — Z79899 Other long term (current) drug therapy: Secondary | ICD-10-CM | POA: Insufficient documentation

## 2016-11-09 DIAGNOSIS — N3001 Acute cystitis with hematuria: Secondary | ICD-10-CM | POA: Diagnosis not present

## 2016-11-09 DIAGNOSIS — I10 Essential (primary) hypertension: Secondary | ICD-10-CM | POA: Insufficient documentation

## 2016-11-09 DIAGNOSIS — K921 Melena: Secondary | ICD-10-CM | POA: Diagnosis not present

## 2016-11-09 DIAGNOSIS — R52 Pain, unspecified: Secondary | ICD-10-CM

## 2016-11-09 DIAGNOSIS — R112 Nausea with vomiting, unspecified: Secondary | ICD-10-CM | POA: Insufficient documentation

## 2016-11-09 DIAGNOSIS — E860 Dehydration: Secondary | ICD-10-CM

## 2016-11-09 DIAGNOSIS — Z7982 Long term (current) use of aspirin: Secondary | ICD-10-CM | POA: Insufficient documentation

## 2016-11-09 DIAGNOSIS — R197 Diarrhea, unspecified: Secondary | ICD-10-CM | POA: Diagnosis not present

## 2016-11-09 DIAGNOSIS — R1111 Vomiting without nausea: Secondary | ICD-10-CM | POA: Diagnosis not present

## 2016-11-09 LAB — COMPREHENSIVE METABOLIC PANEL
ALBUMIN: 3 g/dL — AB (ref 3.5–5.0)
ALK PHOS: 84 U/L (ref 38–126)
ALK PHOS: 54 U/L (ref 38–126)
ALT: 27 U/L (ref 14–54)
ALT: 20 U/L (ref 14–54)
ANION GAP: 7 (ref 5–15)
AST: 86 U/L — AB (ref 15–41)
AST: 22 U/L (ref 15–41)
Albumin: 2.1 g/dL — ABNORMAL LOW (ref 3.5–5.0)
Anion gap: 9 (ref 5–15)
BILIRUBIN TOTAL: 0.5 mg/dL (ref 0.3–1.2)
BUN: 35 mg/dL — ABNORMAL HIGH (ref 6–20)
BUN: 27 mg/dL — AB (ref 6–20)
CALCIUM: 9.8 mg/dL (ref 8.9–10.3)
CALCIUM: 7.1 mg/dL — AB (ref 8.9–10.3)
CHLORIDE: 99 mmol/L — AB (ref 101–111)
CO2: 27 mmol/L (ref 22–32)
CO2: 20 mmol/L — ABNORMAL LOW (ref 22–32)
CREATININE: 0.66 mg/dL (ref 0.44–1.00)
Chloride: 112 mmol/L — ABNORMAL HIGH (ref 101–111)
Creatinine, Ser: 0.35 mg/dL — ABNORMAL LOW (ref 0.44–1.00)
GFR calc Af Amer: >60 mL/min (ref >60)
GFR calc non Af Amer: >60 mL/min (ref >60)
GLUCOSE: 114 mg/dL — AB (ref 65–99)
GLUCOSE: 105 mg/dL — AB (ref 65–99)
POTASSIUM: 3.2 mmol/L — AB (ref 3.5–5.1)
Potassium: 7.3 mmol/L (ref 3.5–5.1)
Sodium: 135 mmol/L (ref 135–145)
Sodium: 139 mmol/L (ref 135–145)
TOTAL PROTEIN: 4.5 g/dL — AB (ref 6.5–8.1)
Total Bilirubin: 2.4 mg/dL — ABNORMAL HIGH (ref 0.3–1.2)
Total Protein: 6.2 g/dL — ABNORMAL LOW (ref 6.5–8.1)

## 2016-11-09 LAB — URINALYSIS, ROUTINE W REFLEX MICROSCOPIC
Bilirubin Urine: NEGATIVE
Glucose, UA: NEGATIVE mg/dL
KETONES UR: NEGATIVE mg/dL
Nitrite: NEGATIVE
PROTEIN: NEGATIVE mg/dL
Specific Gravity, Urine: 1.012 (ref 1.005–1.030)
pH: 6 (ref 5.0–8.0)

## 2016-11-09 LAB — CBC WITH DIFFERENTIAL/PLATELET
Basophils Absolute: 0.1 10*3/uL (ref 0.0–0.1)
Basophils Relative: 0 %
Eosinophils Absolute: 0.2 10*3/uL (ref 0.0–0.7)
Eosinophils Relative: 1 %
HEMATOCRIT: 47.2 % — AB (ref 36.0–46.0)
HEMOGLOBIN: 16.3 g/dL — AB (ref 12.0–15.0)
LYMPHS ABS: 1.8 10*3/uL (ref 0.7–4.0)
LYMPHS PCT: 7 %
MCH: 36.1 pg — ABNORMAL HIGH (ref 26.0–34.0)
MCHC: 34.5 g/dL (ref 30.0–36.0)
MCV: 104.7 fL — ABNORMAL HIGH (ref 78.0–100.0)
Monocytes Absolute: 1.3 10*3/uL — ABNORMAL HIGH (ref 0.1–1.0)
Monocytes Relative: 5 %
NEUTROS ABS: 24 10*3/uL — AB (ref 1.7–7.7)
NEUTROS PCT: 88 %
Platelets: 415 10*3/uL — ABNORMAL HIGH (ref 150–400)
RBC: 4.51 MIL/uL (ref 3.87–5.11)
RDW: 12.9 % (ref 11.5–15.5)
WBC: 27.4 10*3/uL — AB (ref 4.0–10.5)

## 2016-11-09 LAB — POC OCCULT BLOOD, ED: FECAL OCCULT BLD: NEGATIVE

## 2016-11-09 MED ORDER — OXYCODONE-ACETAMINOPHEN 5-325 MG PO TABS
1.0000 | ORAL_TABLET | Freq: Once | ORAL | Status: AC
  Administered 2016-11-09: 1 via ORAL
  Filled 2016-11-09: qty 1

## 2016-11-09 MED ORDER — CEFTRIAXONE SODIUM 1 G IJ SOLR
1.0000 g | Freq: Once | INTRAMUSCULAR | Status: AC
  Administered 2016-11-09: 1 g via INTRAVENOUS
  Filled 2016-11-09: qty 10

## 2016-11-09 MED ORDER — ONDANSETRON 4 MG PO TBDP: ORAL_TABLET | ORAL | 0 refills | Status: AC

## 2016-11-09 MED ORDER — CEPHALEXIN 500 MG PO CAPS
500.0000 mg | ORAL_CAPSULE | Freq: Four times a day (QID) | ORAL | 0 refills | Status: DC
Start: 2016-11-09 — End: 2016-11-25

## 2016-11-09 MED ORDER — SODIUM CHLORIDE 0.9 % IV BOLUS (SEPSIS)
2000.0000 mL | Freq: Once | INTRAVENOUS | Status: AC
  Administered 2016-11-09: 2000 mL via INTRAVENOUS

## 2016-11-09 NOTE — ED Notes (Signed)
Pt has poor IV access. This RN tried to get IV in right ac.

## 2016-11-09 NOTE — ED Notes (Addendum)
Report given to [facility] at [facility].

## 2016-11-09 NOTE — ED Notes (Signed)
Swelling to IV site in left hand.  Stopped IV Fluids

## 2016-11-09 NOTE — ED Notes (Signed)
PICC nurse at bedside.

## 2016-11-09 NOTE — ED Notes (Signed)
IV fluids stopped.

## 2016-11-09 NOTE — ED Notes (Signed)
Lab unable to get any blood. MD made aware.

## 2016-11-09 NOTE — ED Triage Notes (Addendum)
Here form [facility].  Several black watery stools.  Positive for occult blood.  Vomited times one.  Also has wound to upper back.

## 2016-11-09 NOTE — Consult Note (Addendum)
History and Physical    Alyssa Coleman:[id number] DOB: October 16, 1940 DOA: 11/09/2016  PCP: Pearson Grippe, MD  Patient coming from: SNF   Chief Complaint: nausea and vomiting x this am.  Reason for consultation:  Admission for dehydration.   HPI: Alyssa Coleman is an 76 y.o. female with hx of chronic leukocytosis, arthritis, HTN,  Chronic prednisone, presented to the ER with nausea, vomiting this am.  She has no abdominal pain, fever, or chills.  Serology showed WBC of 24K, Cr of 0.6, and K was 7.3  Repeat K was normal at 3.2 mE/L, and WBC showed TNTC WBCs, with a chronic foley.  She was started on IVF as she was felt be be volume depleted, after a PICC line was placed.  CT scan of the abdomen showed moderate hydronephrosis of the left kidney, with stones presents.  She has no flank pain.  She said she feels well and would prefer to go back to the [facility].   ED Course:  See above.  Rewiew of Systems:  Constitutional: Negative for malaise, fever and chills. No significant weight loss or weight gain Eyes: Negative for eye pain, redness and discharge, diplopia, visual changes, or flashes of light. ENMT: Negative for ear pain, hoarseness, nasal congestion, sinus pressure and sore throat. No headaches; tinnitus, drooling, or problem swallowing. Cardiovascular: Negative for chest pain, palpitations, diaphoresis, dyspnea and peripheral edema. ; No orthopnea, PND Respiratory: Negative for cough, hemoptysis, wheezing and stridor. No pleuritic chestpain. Gastrointestinal: Negative for diarrhea, constipation,  melena, blood in stool, hematemesis, jaundice and rectal bleeding.    Genitourinary: Negative for frequency, dysuria, incontinence,flank pain and hematuria; Musculoskeletal: Negative for back pain and neck pain. Negative for swelling and trauma.;  Skin: . Negative for pruritus, rash, abrasions, bruising and skin lesion.; ulcerations Neuro: Negative for headache, lightheadedness and neck  stiffness. Negative for weakness, altered level of consciousness , altered mental status, extremity weakness, burning feet, involuntary movement, seizure and syncope.  Psych: negative for anxiety, depression, insomnia, tearfulness, panic attacks, hallucinations, paranoia, suicidal or homicidal ideation    Past Medical History:  Diagnosis Date  . Angina   . Anxiety   . Arthritis   . Blood transfusion   . CAP (community acquired pneumonia) 10/02/2016  . GERD (gastroesophageal reflux disease)   . Hypertension   . Myocardial infarction   . PONV (postoperative nausea and vomiting)   . Shortness of breath   . Spinal headache    Past Surgical History:  Procedure Laterality Date  . CARDIAC CATHETERIZATION    . HAND SURGERY  1980   skin infection  . JOINT REPLACEMENT    . ORIF PERIPROSTHETIC FRACTURE  09/17/2011   Procedure: OPEN REDUCTION INTERNAL FIXATION (ORIF) PERIPROSTHETIC FRACTURE;  Surgeon: Shelda Pal, MD;  Location: [facility];  Service: Orthopedics;  Laterality: Left;  . TOTAL HIP ARTHROPLASTY  Feb 97   left  . TOTAL HIP ARTHROPLASTY  aug 97   right hip  . TOTAL KNEE ARTHROPLASTY  1998   left     reports that she has never smoked. She has never used smokeless tobacco. She reports that she does not drink alcohol or use drugs.  Allergies  Allergen Reactions  . Adult Aspirin Ec [Aspirin] Nausea Only  . Codeine Nausea Only  . Hydrocodone Hives  . Sulfa Antibiotics Other (See Comments)    unknown    History reviewed. No pertinent family history.   Prior to Admission medications   Medication Sig Start Date  End Date Taking? Authorizing Provider  acetaminophen (TYLENOL) 500 MG tablet Take 500 mg by mouth every 6 (six) hours as needed.   Yes Historical Provider, MD  Amino Acids-Protein Hydrolys (PRO-STAT 101 PO) Take three times a day   Yes Historical Provider, MD  aspirin 81 MG tablet Take 1 tablet (81 mg total) by mouth 2 (two) times daily. 09/19/11  Yes Lanney Gins, PA-C   Calcium Carbonate-Vitamin D (CALTRATE 600+D) 600-400 MG-UNIT per tablet Take 1 tablet by mouth daily.   Yes Historical Provider, MD  cholecalciferol (VITAMIN D) 1000 UNITS tablet Take 1,000 Units by mouth daily.   Yes Historical Provider, MD  ferrous sulfate 325 (65 FE) MG EC tablet Take 325 mg by mouth daily with breakfast.   Yes Historical Provider, MD  fish oil-omega-3 fatty acids 1000 MG capsule Take 1 g by mouth 3 (three) times daily.   Yes Historical Provider, MD  folic acid (FOLVITE) 1 MG tablet Take 1 mg by mouth daily.   Yes Historical Provider, MD  lisinopril (PRINIVIL,ZESTRIL) 5 MG tablet Take 5 mg by mouth daily.   Yes Historical Provider, MD  LORazepam (ATIVAN) 0.5 MG tablet Take 0.5 mg by mouth every 6 (six) hours as needed for anxiety.   Yes Historical Provider, MD  Multiple Vitamin (MULITIVITAMIN WITH MINERALS) TABS Take 1 tablet by mouth daily.   Yes Historical Provider, MD  nystatin (NYSTATIN) powder Apply to fungal rash on neck and back every shift   Yes Historical Provider, MD  oxyCODONE (ROXICODONE) 5 MG/5ML solution Give 2.29ml every 8 hours as needed for pain 11/05/16  Yes Kimber Relic, MD  predniSONE (DELTASONE) 5 MG tablet Take 5 mg by mouth daily.  02/23/13  Yes Historical Provider, MD  Probiotic Product (RISA-BID PROBIOTIC PO) Give one tablet by mouth daily   Yes Historical Provider, MD  thyroid (ARMOUR) 90 MG tablet Take 90 mg by mouth daily.   Yes Historical Provider, MD  traMADol (ULTRAM) 50 MG tablet Take 1 tablet (50 mg total) by mouth every 6 (six) hours as needed for pain. 04/19/13  Yes Dione Booze, MD  collagenase [facility]) ointment Apply one application per treatment orders    Historical Provider, MD    Physical Exam: Vitals:   11/09/16 1100 11/09/16 1200 11/09/16 1300 11/09/16 1403  BP: 122/67 134/80 118/62 118/60  Pulse: (!) 106 (!) 114 (!) 102 (!) 105  Resp: 18 16 (!) 29 17  Temp:      TempSrc:      SpO2: 96% 95% 95% 96%  Weight:      Height:           Constitutional: NAD, calm, comfortable Vitals:   11/09/16 1100 11/09/16 1200 11/09/16 1300 11/09/16 1403  BP: 122/67 134/80 118/62 118/60  Pulse: (!) 106 (!) 114 (!) 102 (!) 105  Resp: 18 16 (!) 29 17  Temp:      TempSrc:      SpO2: 96% 95% 95% 96%  Weight:      Height:       Eyes: PERRL, lids and conjunctivae normal ENMT: Mucous membranes are moist. Posterior pharynx clear of any exudate or lesions.Normal dentition.  Neck: normal, supple, no masses, no thyromegaly Respiratory: clear to auscultation bilaterally, no wheezing, no crackles. Normal respiratory effort. No accessory muscle use.  Cardiovascular: Regular rate and rhythm, no murmurs / rubs / gallops. No extremity edema. 2+ pedal pulses. No carotid bruits.  Abdomen: no tenderness, no masses palpated. No hepatosplenomegaly. Bowel sounds  positive.  Musculoskeletal: no clubbing / cyanosis. No joint deformity upper and lower extremities. Good ROM, no contractures. Normal muscle tone.  Skin: no rashes, lesions, ulcers. No induration Neurologic: CN 2-12 grossly intact. Sensation intact, DTR normal. Strength 5/5 in all 4.  Psychiatric: Normal judgment and insight. Alert and oriented x 3. Normal mood.   Labs on Admission: I have personally reviewed following labs and imaging studies  CBC:  Recent Labs Lab 11/09/16 1006  WBC 27.4*  NEUTROABS 24.0*  HGB 16.3*  HCT 47.2*  MCV 104.7*  PLT 415*   Basic Metabolic Panel:  Recent Labs Lab 11/09/16 1006 11/09/16 1405  NA 135 139  K 7.3* 3.2*  CL 99* 112*  CO2 27 20*  GLUCOSE 114* 105*  BUN 35* 27*  CREATININE 0.66 0.35*  CALCIUM 9.8 7.1*   GFR: Estimated Creatinine Clearance: 44.4 mL/min (A) (by C-G formula based on SCr of 0.35 mg/dL (L)). Liver Function Tests:  Recent Labs Lab 11/09/16 1006 11/09/16 1405  AST 86* 22  ALT 27 20  ALKPHOS 84 54  BILITOT 2.4* 0.5  PROT 6.2* 4.5*  ALBUMIN 3.0* 2.1*   Urine analysis:    Component Value Date/Time    COLORURINE YELLOW 10/02/2016 1129   APPEARANCEUR CLOUDY (A) 10/02/2016 1129   LABSPEC 1.015 10/02/2016 1129   PHURINE 5.0 10/02/2016 1129   GLUCOSEU NEGATIVE 10/02/2016 1129   HGBUR NEGATIVE 10/02/2016 1129   BILIRUBINUR NEGATIVE 10/02/2016 1129   KETONESUR NEGATIVE 10/02/2016 1129   PROTEINUR NEGATIVE 10/02/2016 1129   NITRITE NEGATIVE 10/02/2016 1129   LEUKOCYTESUR LARGE (A) 10/02/2016 1129    Radiological Exams on Admission: Ct Renal Stone Study  Result Date: 11/09/2016 CLINICAL DATA:  76 year old female with history of black watery stools positive for occult blood. Vomiting. EXAM: CT ABDOMEN AND PELVIS WITHOUT CONTRAST TECHNIQUE: Multidetector CT imaging of the abdomen and pelvis was performed following the standard protocol without IV contrast. COMPARISON:  No priors. FINDINGS: Lower chest: Volume loss and airspace consolidation in the left lower lobe. Small left pleural effusion. Subsegmental atelectasis in the right lower lobe. Atherosclerotic calcifications in the left anterior descending and right coronary arteries. Calcifications of the aortic valve. Hepatobiliary: No discrete cystic or solid hepatic lesions are identified on today's noncontrast CT examination. Partially calcified gallstone measuring up to 2 cm in diameter in the fundus of the gallbladder. No evidence of acute cholecystitis at this time. Pancreas: No definite pancreatic mass or peripancreatic inflammatory changes are noted on today's noncontrast CT examination. Spleen: Unremarkable. Adrenals/Urinary Tract: Multiple large calculi in the collecting systems of the kidneys bilaterally, largest of which fills much of the right renal pelvis and collecting system where there is a staghorn calculus which measures approximately 1.7 x 3.9 x 3.2 cm (axial image 41 of series 3 and coronal image 61 of series 6). Multiple calculi are also present in the proximal third of the left ureter in total spanning a length of 3.9 cm (coronal image  51 of series 6). This is associated with moderate proximal left hydroureteronephrosis. No definite calculi within the lumen of the urinary bladder, although much of the urinary bladder is obscured by extensive beam hardening artifact from the patient's bilateral hip arthroplasties. Multiple low-attenuation lesions in the right kidney, incompletely characterized on today's noncontrast CT examination, but likely to represent cysts, measuring up to 2.4 cm. Foley balloon catheter present within the lumen of the urinary bladder. Bilateral adrenal glands are normal in appearance. Stomach/Bowel: Unenhanced appearance of the stomach is normal.  No pathologic dilatation of small bowel or colon. Numerous colonic diverticulae are noted, without surrounding inflammatory changes to suggest an acute diverticulitis at this time. Normal appendix. Vascular/Lymphatic: Aortic atherosclerosis, without definite aneurysm in the abdominal or pelvic vasculature. No lymphadenopathy noted in the abdomen or pelvis on today's noncontrast CT examination. Reproductive: Uterus is not confidently identified, likely surgically absent. Low anatomic pelvis is largely obscured by beam hardening artifact from bilateral hip arthroplasties. Ovaries are atrophic. Other: No significant volume of ascites.  No pneumoperitoneum. Musculoskeletal: Multiple chronic appearing compression fractures are noted in the thorax the lumbar spine, involving T8, T9, T10, T12, L1, L3 and L4. These are most severe at T8 where there is complete collapse and vertebra plana appearance. Status post bilateral total hip arthroplasty. Old healed left inferior pubic ramus fracture. IMPRESSION: 1. Colonic diverticulosis. No findings to suggest an acute diverticulitis at this time. 2. Numerous large calculi in the collecting systems of the kidneys bilaterally, including a staghorn calculus in the right renal pelvis and collecting system which measures 1.7 x 3.9 x 3.2 cm. In addition,  there is a cluster of multiple large calculi in the proximal third of the left ureter which span a total length of 3.9 cm and are associated with moderate left hydroureteronephrosis indicative of obstruction. 3. Combination of atelectasis and airspace consolidation in the left lower lobe concerning for pneumonia. Small left parapneumonic pleural effusion. 4. Aortic atherosclerosis, in addition to 2 vessel coronary artery disease. Please note that although the presence of coronary artery calcium documents the presence of coronary artery disease, the severity of this disease and any potential stenosis cannot be assessed on this non-gated CT examination. Assessment for potential risk factor modification, dietary therapy or pharmacologic therapy may be warranted, if clinically indicated. 5. There are calcifications of the aortic valve. Echocardiographic correlation for evaluation of potential valvular dysfunction may be warranted if clinically indicated. 6. Multiple old thoracolumbar vertebral body compression fractures, as above. 7. Additional incidental findings, as above. Electronically Signed   By: Trudie Reed M.D.   On: 11/09/2016 13:09   Assessment/Plan  PLAN:   Dehydration:  She will be given one liter of IVF, and I feel she can be discharged back to SNF center.  Her BUN and Cr was normal.  She has a PICC line, which can be left in place for now.  She can have it removed later per her PCP at a later time.  She doesn't require hospitalization, nor does she meets acute criteria to be in the hospital.  I spoke her, her sister and her husband, and they all agreed.   PYURIA: urine culture done.  She has chronic foley, but Dr Estell Harpin and I decided to Tx her with one dose of IV rocephin and oral keflex.   Hydronephrosis:  Moderate.  I spoke with Dr Letha Cape, and he recommended outpatient follow up.  It is unlikely she will require a ureteral stent.  Spoke with Dr Estell Harpin about this.  Leukocytosis:  This  is chronic.  Follow up with her PCP.    Jaretssi Kraker MD FACP. [facility]  If 7PM-7AM, please contact night-coverage [email or web address] Password [email or web address]  11/09/2016, 3:10 PM

## 2016-11-09 NOTE — Discharge Instructions (Signed)
Follow-up with [facility] this week. Also follow-up with your family doctor this week

## 2016-11-09 NOTE — ED Notes (Signed)
CRITICAL VALUE ALERT  Critical value received:  Potassium 7.3  Date of notification:  11/09/16  Time of notification:  1216  Critical value read back:Yes.    Nurse who received alert:  Myrene Buddy RN  MD notified (1st page):  Zammit MD  Time of first page:  1218  MD recommended EKG.

## 2016-11-09 NOTE — ED Provider Notes (Signed)
[facility]-EMERGENCY DEPT Provider Note   CSN: [id number] Arrival date & time: 11/09/16  5009  By signing my name below, I, Modena Jansky, attest that this documentation has been prepared under the direction and in the presence of Bethann Berkshire, MD. Electronically Signed: Modena Jansky, Scribe. 11/09/2016. 10:02 AM.  History   Chief Complaint Chief Complaint  Patient presents with  . Diarrhea   The history is provided by the patient and a relative. No language interpreter was used.  Diarrhea   This is a new problem. The current episode started yesterday. The problem occurs 2 to 4 times per day. The problem has not changed since onset.The stool consistency is described as watery and bloody. There has been no fever. Associated symptoms include vomiting. Pertinent negatives include no abdominal pain, no headaches and no cough. She has tried nothing for the symptoms.   HPI Comments: Alyssa Coleman is a 76 y.o. female with a PMHx of Arthritis who presents to the Emergency Department complaining of blood in stool that started yesterday. She states she had a few episodes of black stool. She had one episode of vomiting today with green emesis and associated nausea. She has been at the [facility] for the past month. She denies any other complaints.     PCP: Pearson Grippe, MD  Past Medical History:  Diagnosis Date  . Angina   . Anxiety   . Arthritis   . Blood transfusion   . CAP (community acquired pneumonia) 10/02/2016  . GERD (gastroesophageal reflux disease)   . Hypertension   . Myocardial infarction   . PONV (postoperative nausea and vomiting)   . Shortness of breath   . Spinal headache     Patient Active Problem List   Diagnosis Date Noted  . Palliative care encounter   . Goals of care, counseling/discussion   . Sepsis (HCC) 10/02/2016  . Community acquired pneumonia 10/02/2016  . Back abscess 10/02/2016  . Periprosthetic fracture around internal prosthetic left hip joint  (HCC) 09/17/2011    Past Surgical History:  Procedure Laterality Date  . CARDIAC CATHETERIZATION    . HAND SURGERY  1980   skin infection  . JOINT REPLACEMENT    . ORIF PERIPROSTHETIC FRACTURE  09/17/2011   Procedure: OPEN REDUCTION INTERNAL FIXATION (ORIF) PERIPROSTHETIC FRACTURE;  Surgeon: Shelda Pal, MD;  Location: [facility];  Service: Orthopedics;  Laterality: Left;  . TOTAL HIP ARTHROPLASTY  Feb 97   left  . TOTAL HIP ARTHROPLASTY  aug 97   right hip  . TOTAL KNEE ARTHROPLASTY  1998   left    OB History    No data available       Home Medications    Prior to Admission medications   Medication Sig Start Date End Date Taking? Authorizing Provider  acetaminophen (TYLENOL) 500 MG tablet Take 500 mg by mouth every 6 (six) hours as needed.   Yes Historical Provider, MD  Amino Acids-Protein Hydrolys (PRO-STAT 101 PO) Take three times a day   Yes Historical Provider, MD  aspirin 81 MG tablet Take 1 tablet (81 mg total) by mouth 2 (two) times daily. 09/19/11  Yes Lanney Gins, PA-C  Calcium Carbonate-Vitamin D (CALTRATE 600+D) 600-400 MG-UNIT per tablet Take 1 tablet by mouth daily.   Yes Historical Provider, MD  cholecalciferol (VITAMIN D) 1000 UNITS tablet Take 1,000 Units by mouth daily.   Yes Historical Provider, MD  ferrous sulfate 325 (65 FE) MG EC tablet Take 325 mg by mouth  daily with breakfast.   Yes Historical Provider, MD  fish oil-omega-3 fatty acids 1000 MG capsule Take 1 g by mouth 3 (three) times daily.   Yes Historical Provider, MD  folic acid (FOLVITE) 1 MG tablet Take 1 mg by mouth daily.   Yes Historical Provider, MD  lisinopril (PRINIVIL,ZESTRIL) 5 MG tablet Take 5 mg by mouth daily.   Yes Historical Provider, MD  LORazepam (ATIVAN) 0.5 MG tablet Take 0.5 mg by mouth every 6 (six) hours as needed for anxiety.   Yes Historical Provider, MD  Multiple Vitamin (MULITIVITAMIN WITH MINERALS) TABS Take 1 tablet by mouth daily.   Yes Historical Provider, MD  nystatin  (NYSTATIN) powder Apply to fungal rash on neck and back every shift   Yes Historical Provider, MD  oxyCODONE (ROXICODONE) 5 MG/5ML solution Give 2.40ml every 8 hours as needed for pain 11/05/16  Yes Kimber Relic, MD  predniSONE (DELTASONE) 5 MG tablet Take 5 mg by mouth daily.  02/23/13  Yes Historical Provider, MD  Probiotic Product (RISA-BID PROBIOTIC PO) Give one tablet by mouth daily   Yes Historical Provider, MD  thyroid (ARMOUR) 90 MG tablet Take 90 mg by mouth daily.   Yes Historical Provider, MD  traMADol (ULTRAM) 50 MG tablet Take 1 tablet (50 mg total) by mouth every 6 (six) hours as needed for pain. 04/19/13  Yes Dione Booze, MD  collagenase [facility]) ointment Apply one application per treatment orders    Historical Provider, MD    Family History History reviewed. No pertinent family history.  Social History Social History  Substance Use Topics  . Smoking status: Never Smoker  . Smokeless tobacco: Never Used  . Alcohol use No     Allergies   Adult aspirin ec [aspirin]; Codeine; Hydrocodone; and Sulfa antibiotics   Review of Systems Review of Systems  Constitutional: Negative for appetite change and fatigue.  HENT: Negative for congestion, ear discharge and sinus pressure.   Eyes: Negative for discharge.  Respiratory: Negative for cough.   Cardiovascular: Negative for chest pain.  Gastrointestinal: Positive for blood in stool, nausea and vomiting. Negative for abdominal pain and diarrhea.  Genitourinary: Negative for frequency and hematuria.  Musculoskeletal: Negative for back pain.  Skin: Negative for rash.  Neurological: Negative for seizures and headaches.  Psychiatric/Behavioral: Negative for hallucinations.     Physical Exam Updated Vital Signs BP (!) 137/58   Pulse (!) 114   Temp 97.9 F (36.6 C) (Oral)   Resp 20   Ht 5\' 7"  (1.702 m)   Wt 99 lb (44.9 kg)   SpO2 93%   BMI 15.51 kg/m   Physical Exam  Constitutional: She is oriented to person, place,  and time. She appears well-developed.  HENT:  Head: Normocephalic.  Mouth/Throat: Mucous membranes are dry.  Dry mucous membranes   Eyes: Conjunctivae and EOM are normal. No scleral icterus.  Neck: Neck supple. No thyromegaly present.  Cardiovascular: Normal rate and regular rhythm.  Exam reveals no gallop and no friction rub.   No murmur heard. Pulmonary/Chest: No stridor. She has no wheezes. She has no rales. She exhibits no tenderness.  Abdominal: She exhibits no distension. There is no tenderness. There is no rebound.  Musculoskeletal: Normal range of motion. She exhibits deformity. She exhibits no edema.  Deformity to both hands and elbows.   Lymphadenopathy:    She has no cervical adenopathy.  Neurological: She is oriented to person, place, and time. She exhibits normal muscle tone. Coordination normal.  Skin: No rash noted. No erythema.  Psychiatric: She has a normal mood and affect. Her behavior is normal.     ED Treatments / Results  DIAGNOSTIC STUDIES: Oxygen Saturation is 93% on RA, normal by my interpretation.    COORDINATION OF CARE: 10:06 AM- Pt advised of plan for treatment and pt agrees.  Labs (all labs ordered are listed, but only abnormal results are displayed) Labs Reviewed  CBC WITH DIFFERENTIAL/PLATELET  COMPREHENSIVE METABOLIC PANEL    EKG  EKG Interpretation None       Radiology No results found.  Procedures Procedures (including critical care time)  Medications Ordered in ED Medications  sodium chloride 0.9 % bolus 2,000 mL (not administered)     Initial Impression / Assessment and Plan / ED Course  I have reviewed the triage vital signs and the nursing notes.  Pertinent labs & imaging results that were available during my care of the patient were reviewed by me and considered in my medical decision making (see chart for details).     Patient with urinary tract infection and dehydration. Patient also has kidney stones. Patient was  evaluated by hospitalist who spoke with the urologist. It was felt that the patient could be sent back to the nursing home and follow-up as an outpatient. She'll be given antibiotics and nausea medicine  Final Clinical Impressions(s) / ED Diagnoses   Final diagnoses:  None    New Prescriptions New Prescriptions   No medications on file   The chart was scribed for me under my direct supervision.  I personally performed the history, physical, and medical decision making and all procedures in the evaluation of this patient.Bethann Berkshire, MD 11/09/16 [phone]

## 2016-11-10 ENCOUNTER — Encounter: Payer: Self-pay | Admitting: Internal Medicine

## 2016-11-10 ENCOUNTER — Non-Acute Institutional Stay (SKILLED_NURSING_FACILITY): Payer: PPO | Admitting: Internal Medicine

## 2016-11-10 DIAGNOSIS — N390 Urinary tract infection, site not specified: Secondary | ICD-10-CM | POA: Diagnosis not present

## 2016-11-10 DIAGNOSIS — M069 Rheumatoid arthritis, unspecified: Secondary | ICD-10-CM | POA: Diagnosis not present

## 2016-11-10 DIAGNOSIS — G43A0 Cyclical vomiting, not intractable: Secondary | ICD-10-CM

## 2016-11-10 DIAGNOSIS — E876 Hypokalemia: Secondary | ICD-10-CM | POA: Diagnosis not present

## 2016-11-10 DIAGNOSIS — R319 Hematuria, unspecified: Secondary | ICD-10-CM | POA: Diagnosis not present

## 2016-11-10 LAB — URINE CULTURE

## 2016-11-10 NOTE — Progress Notes (Signed)
Location:   [facility]  Room Number 130/P Place of Service:  SNF [phone]) Provider:  Lucretia Field, MD  Patient Care Team: Jani Gravel, MD as PCP - General (Internal Medicine)  Extended Emergency Contact Information Primary Emergency Contact: Noah Charon Address: [address]          [place], [place] [address] [place] Phone: [phone] Relation: Spouse  Code Status:  DNR Goals of care: Advanced Directive information Advanced Directives 11/10/2016  Does Patient Have a Medical Advance Directive? Yes  Type of Advance Directive Out of facility DNR (pink MOST or yellow form)  Does patient want to make changes to medical advance directive? No - Patient declined  Would patient like information on creating a medical advance directive? No - Patient declined  Pre-existing out of facility DNR order (yellow form or pink MOST form) -     Chief Complaint  Patient presents with  . Acute Visit    F/U UTI    HPI:  Pt is a 76 y.o. female seen today for an acute visit for Follow-up of apparent UTI.  Patient had an episode of vomiting and diarrhea with possible blood apparently yesterday in facility and was sent to the ER.  This also was associated with nausea.  In the ER she was diagnosed with suspected UTI with large leukocytes many bacteria and WBCs too numerous to count.  She was put on Keflex empirically.  STOOL testing was negative for blood  She also had a CT of the kidneys did show numerous kidney stones as well as combination of atelectasis and airspace consolidation a left lower lobe-she has been afebrile does not complain of cough or increased congestion or shortness of breath.  She has a history of a back wound as well as elevated white count-per previous assessment by Dr. Josefine Class is more on comfort here and is not on antibiotic for this  Apparently there are plans for discharge to hospice when she is discharged from  facility.  She does have a history of significant rheumatoid arthritis she was seen by Dr. Dellia Nims recently and started on prednisone which appears to be helping with her pain she is also on low dose oxycodone 2.5 mg every 8 hours when necessary.  She says she is more comfortable.  Currently she is lying in bed comfortably does not have any acute complaints continues to be quite frail and weak  \I do note on labs done hospital yesterday potassium was low at 3.2.           Past Medical History:  Diagnosis Date  . Angina   . Anxiety   . Arthritis   . Blood transfusion   . CAP (community acquired pneumonia) 10/02/2016  . GERD (gastroesophageal reflux disease)   . Hypertension   . Myocardial infarction   . PONV (postoperative nausea and vomiting)   . Shortness of breath   . Spinal headache    Past Surgical History:  Procedure Laterality Date  . CARDIAC CATHETERIZATION    . HAND SURGERY  1980   skin infection  . JOINT REPLACEMENT    . ORIF PERIPROSTHETIC FRACTURE  09/17/2011   Procedure: OPEN REDUCTION INTERNAL FIXATION (ORIF) PERIPROSTHETIC FRACTURE;  Surgeon: Mauri Pole, MD;  Location: [facility];  Service: Orthopedics;  Laterality: Left;  . TOTAL HIP ARTHROPLASTY  Feb 97   left  . TOTAL HIP ARTHROPLASTY  aug 97   right hip  . TOTAL KNEE ARTHROPLASTY  1998   left    Allergies  Allergen Reactions  . Adult Aspirin Ec [Aspirin] Nausea Only  . Codeine Nausea Only  . Hydrocodone Hives  . Sulfa Antibiotics Other (See Comments)    unknown    Allergies as of 11/10/2016      Reactions   Adult Aspirin Ec [aspirin] Nausea Only   Codeine Nausea Only   Hydrocodone Hives   Sulfa Antibiotics Other (See Comments)   unknown      Medication List       Accurate as of 11/10/16  2:27 PM. Always use your most recent med list.          acetaminophen 500 MG tablet Commonly known as:  TYLENOL Take 500 mg by mouth every 6 (six) hours as needed.   aspirin 81 MG tablet Take  1 tablet (81 mg total) by mouth 2 (two) times daily.   CALTRATE 600+D 600-400 MG-UNIT tablet Generic drug:  Calcium Carbonate-Vitamin D Take 1 tablet by mouth daily.   cephALEXin 500 MG capsule Commonly known as:  KEFLEX Take 1 capsule (500 mg total) by mouth 4 (four) times daily.   cholecalciferol 1000 units tablet Commonly known as:  VITAMIN D Take 1,000 Units by mouth daily.   ferrous sulfate 325 (65 FE) MG EC tablet Take 325 mg by mouth daily with breakfast.   fish oil-omega-3 fatty acids 1000 MG capsule Take 1 g by mouth 3 (three) times daily.   folic acid 1 MG tablet Commonly known as:  FOLVITE Take 1 mg by mouth daily.   lisinopril 5 MG tablet Commonly known as:  PRINIVIL,ZESTRIL Take 5 mg by mouth daily.   LORazepam 0.5 MG tablet Commonly known as:  ATIVAN Take 0.5 mg by mouth every 6 (six) hours as needed for anxiety.   multivitamin with minerals Tabs tablet Take 1 tablet by mouth daily.   nystatin powder Generic drug:  nystatin Apply to fungal rash on neck and back every shift   ondansetron 4 MG disintegrating tablet Commonly known as:  ZOFRAN ODT 4mg  ODT q4 hours prn nausea/vomit   oxyCODONE 5 MG/5ML solution Commonly known as:  ROXICODONE Give 2.59ml every 8 hours as needed for pain   predniSONE 5 MG tablet Commonly known as:  DELTASONE Take 5 mg by mouth daily.   PRO-STAT 101 PO Take three times a day   RISA-BID PROBIOTIC PO Give one tablet by mouth daily   SANTYL ointment Generic drug:  collagenase Apply one application per treatment orders   thyroid 90 MG tablet Commonly known as:  ARMOUR Take 90 mg by mouth daily.   traMADol 50 MG tablet Commonly known as:  ULTRAM Take 1 tablet (50 mg total) by mouth every 6 (six) hours as needed for pain.       Review of Systems   Gen. she is not complaining any fever or chills.  Skin does have history of significant back wound as noted above.  Head ears eyes nose mouth and throat does not  complain of visual changes or sore throat.  Respiratory does not complaining cough or shortness of breath.  Cardiac not complaining of chest pain.  GI-does complain of nausea that is relieved apparently with the Zofran does not complain of vomiting at this time or abdominal discomfort.  GU has an indwelling Foley catheter again being treated for suspected UTI does not complaining of overt dysuria.  Musculoskeletal says her joint pain is better controlled now.  Neurologic is not complaining of  dizziness headache or syncope.  Psych continues to be in good spirits despite her numerous challenges   There is no immunization history on file for this patient. Pertinent  Health Maintenance Due  Topic Date Due  . MAMMOGRAM  06/24/1993  . COLONOSCOPY  06/24/1993  . DEXA SCAN  06/24/2008  . INFLUENZA VACCINE  07/25/2017 (Originally 03/25/2016)  . PNA vac Low Risk Adult (1 of 2 - PCV13) 07/25/2017 (Originally 06/24/2008)   No flowsheet data found. Functional Status Survey:   Temperature is 97.9 pulse 86 respirations 20 blood pressure 118/71   Physical Exam   In general this is a very frail elderly female who does not appear to be in distress resting comfortably in bed.  Her skin is warm and dry she does have PICC line apply to her right upper arm there is some bruising of her right elbow area with some edema.  Oropharynx is clear mucous membranes are slightly dry.  Chest is clear to auscultation with somewhat shallow air entry there is no labored breathing.  Heart is regular rate and rhythm without murmur gallop or rub.  Abdomen soft nontender positive bowel sounds.  GU does have a Foley catheter placed draining dark amber colored urine possibly some minimal suprapubic tenderness.  Musculoskeletal has diffuse contractured of her upper extremities and hands compatible with rheumatoid arthritis has significant weakness of her lower extremities her feet are in protective boots  currently general muscle wasting noted.  Neurologic is grossly intact her speech is clear.  Psych she is alert and oriented pleasant and appropriate        Labs reviewed:  Recent Labs  10/02/16 2020  10/09/16 0830 10/11/16 1518 11/09/16 1006 11/09/16 1405  NA  --   < > 139 131* 135 139  K  --   < > 4.0 4.6 7.3* 3.2*  CL  --   < > 99* 95* 99* 112*  CO2  --   < > 28 29 27  20*  GLUCOSE  --   < > 58* 119* 114* 105*  BUN  --   < > 11 13 35* 27*  CREATININE  --   < > 0.43* 0.46 0.66 0.35*  CALCIUM  --   < > 9.1 9.0 9.8 7.1*  MG 1.2*  --  1.9  --   --   --   < > = values in this interval not displayed.  Recent Labs  10/02/16 1735 11/09/16 1006 11/09/16 1405  AST 16 86* 22  ALT 10* 27 20  ALKPHOS 101 84 54  BILITOT 0.7 2.4* 0.5  PROT 4.8* 6.2* 4.5*  ALBUMIN 1.8* 3.0* 2.1*    Recent Labs  10/02/16 1735  10/09/16 0830 10/11/16 1518 11/09/16 1006  WBC 36.4*  < > 25.7* 27.7* 27.4*  NEUTROABS 34.0*  --  20.8*  --  24.0*  HGB 12.8  < > 14.0 12.9 16.3*  HCT 36.3  < > 42.3 37.5 47.2*  MCV 103.1*  < > 106.5* 103.9* 104.7*  PLT 433*  < > 355 395 415*  < > = values in this interval not displayed. Lab Results  Component Value Date   TSH 4.528 (H) 10/09/2016   No results found for: HGBA1C No results found for: CHOL, HDL, LDLCALC, LDLDIRECT, TRIG, CHOLHDL  Significant Diagnostic Results in last 30 days:  Ct Renal Stone Study  Result Date: 11/09/2016 CLINICAL DATA:  76 year old female with history of black watery stools positive for occult blood. Vomiting. EXAM: CT  ABDOMEN AND PELVIS WITHOUT CONTRAST TECHNIQUE: Multidetector CT imaging of the abdomen and pelvis was performed following the standard protocol without IV contrast. COMPARISON:  No priors. FINDINGS: Lower chest: Volume loss and airspace consolidation in the left lower lobe. Small left pleural effusion. Subsegmental atelectasis in the right lower lobe. Atherosclerotic calcifications in the left anterior  descending and right coronary arteries. Calcifications of the aortic valve. Hepatobiliary: No discrete cystic or solid hepatic lesions are identified on today's noncontrast CT examination. Partially calcified gallstone measuring up to 2 cm in diameter in the fundus of the gallbladder. No evidence of acute cholecystitis at this time. Pancreas: No definite pancreatic mass or peripancreatic inflammatory changes are noted on today's noncontrast CT examination. Spleen: Unremarkable. Adrenals/Urinary Tract: Multiple large calculi in the collecting systems of the kidneys bilaterally, largest of which fills much of the right renal pelvis and collecting system where there is a staghorn calculus which measures approximately 1.7 x 3.9 x 3.2 cm (axial image 41 of series 3 and coronal image 61 of series 6). Multiple calculi are also present in the proximal third of the left ureter in total spanning a length of 3.9 cm (coronal image 51 of series 6). This is associated with moderate proximal left hydroureteronephrosis. No definite calculi within the lumen of the urinary bladder, although much of the urinary bladder is obscured by extensive beam hardening artifact from the patient's bilateral hip arthroplasties. Multiple low-attenuation lesions in the right kidney, incompletely characterized on today's noncontrast CT examination, but likely to represent cysts, measuring up to 2.4 cm. Foley balloon catheter present within the lumen of the urinary bladder. Bilateral adrenal glands are normal in appearance. Stomach/Bowel: Unenhanced appearance of the stomach is normal. No pathologic dilatation of small bowel or colon. Numerous colonic diverticulae are noted, without surrounding inflammatory changes to suggest an acute diverticulitis at this time. Normal appendix. Vascular/Lymphatic: Aortic atherosclerosis, without definite aneurysm in the abdominal or pelvic vasculature. No lymphadenopathy noted in the abdomen or pelvis on today's  noncontrast CT examination. Reproductive: Uterus is not confidently identified, likely surgically absent. Low anatomic pelvis is largely obscured by beam hardening artifact from bilateral hip arthroplasties. Ovaries are atrophic. Other: No significant volume of ascites.  No pneumoperitoneum. Musculoskeletal: Multiple chronic appearing compression fractures are noted in the thorax the lumbar spine, involving T8, T9, T10, T12, L1, L3 and L4. These are most severe at T8 where there is complete collapse and vertebra plana appearance. Status post bilateral total hip arthroplasty. Old healed left inferior pubic ramus fracture. IMPRESSION: 1. Colonic diverticulosis. No findings to suggest an acute diverticulitis at this time. 2. Numerous large calculi in the collecting systems of the kidneys bilaterally, including a staghorn calculus in the right renal pelvis and collecting system which measures 1.7 x 3.9 x 3.2 cm. In addition, there is a cluster of multiple large calculi in the proximal third of the left ureter which span a total length of 3.9 cm and are associated with moderate left hydroureteronephrosis indicative of obstruction. 3. Combination of atelectasis and airspace consolidation in the left lower lobe concerning for pneumonia. Small left parapneumonic pleural effusion. 4. Aortic atherosclerosis, in addition to 2 vessel coronary artery disease. Please note that although the presence of coronary artery calcium documents the presence of coronary artery disease, the severity of this disease and any potential stenosis cannot be assessed on this non-gated CT examination. Assessment for potential risk factor modification, dietary therapy or pharmacologic therapy may be warranted, if clinically indicated. 5. There are calcifications  of the aortic valve. Echocardiographic correlation for evaluation of potential valvular dysfunction may be warranted if clinically indicated. 6. Multiple old thoracolumbar vertebral body  compression fractures, as above. 7. Additional incidental findings, as above. Electronically Signed   By: Vinnie Langton M.D.   On: 11/09/2016 13:09    Assessment/Plan #1-ER visit with suspected UTI-she has put on Keflex empirically will await culture results.  Clinically appears to be stable still complaints of nausea however she is receiving Zofran with relief-apparently she is largely on a clear liquid diet hopefully this can be titrated up here in the near future.  Clinically she appears to be very frail but at this point not unstable.  There was a suggestion for urology follow-up-however I spoke with her husband at bedside and at this point he appears somewhat hesitant about being aggressive here and following up on this however will monitor and readdress this as needed  #2 history of hyperkalemia Will supplement this with potassium 20 mEq a day and update a lab later this week.  #3 history of rheumatoid arthritis-her pain appears to be under better control now on the low-dose prednisone also has when necessary oxycodone 2.5 mg if needed.    [id number]

## 2016-11-12 ENCOUNTER — Encounter (HOSPITAL_COMMUNITY)
Admission: RE | Admit: 2016-11-12 | Discharge: 2016-11-12 | Disposition: A | Discharge status: Home / Self Care | Payer: PPO | Source: Skilled Nursing Facility | Attending: Internal Medicine | Admitting: Internal Medicine

## 2016-11-12 DIAGNOSIS — F411 Generalized anxiety disorder: Secondary | ICD-10-CM | POA: Insufficient documentation

## 2016-11-12 DIAGNOSIS — L02212 Cutaneous abscess of back [any part, except buttock]: Secondary | ICD-10-CM | POA: Insufficient documentation

## 2016-11-12 DIAGNOSIS — K219 Gastro-esophageal reflux disease without esophagitis: Secondary | ICD-10-CM | POA: Insufficient documentation

## 2016-11-12 DIAGNOSIS — I10 Essential (primary) hypertension: Secondary | ICD-10-CM | POA: Insufficient documentation

## 2016-11-12 DIAGNOSIS — M0549 Rheumatoid myopathy with rheumatoid arthritis of multiple sites: Secondary | ICD-10-CM | POA: Insufficient documentation

## 2016-11-12 LAB — BASIC METABOLIC PANEL
Anion gap: 10 (ref 5–15)
BUN: 12 mg/dL (ref 6–20)
CALCIUM: 9.1 mg/dL (ref 8.9–10.3)
CO2: 25 mmol/L (ref 22–32)
CREATININE: 0.41 mg/dL — AB (ref 0.44–1.00)
Chloride: 99 mmol/L — ABNORMAL LOW (ref 101–111)
GFR calc non Af Amer: >60 mL/min (ref >60)
Glucose, Bld: 112 mg/dL — ABNORMAL HIGH (ref 65–99)
Potassium: 3.2 mmol/L — ABNORMAL LOW (ref 3.5–5.1)
SODIUM: 134 mmol/L — AB (ref 135–145)

## 2016-11-14 ENCOUNTER — Encounter (HOSPITAL_COMMUNITY)
Admission: RE | Admit: 2016-11-14 | Discharge: 2016-11-14 | Disposition: A | Discharge status: Home / Self Care | Payer: PPO | Source: Skilled Nursing Facility | Attending: Internal Medicine | Admitting: Internal Medicine

## 2016-11-14 DIAGNOSIS — K219 Gastro-esophageal reflux disease without esophagitis: Secondary | ICD-10-CM | POA: Insufficient documentation

## 2016-11-14 DIAGNOSIS — L02212 Cutaneous abscess of back [any part, except buttock]: Secondary | ICD-10-CM | POA: Insufficient documentation

## 2016-11-14 DIAGNOSIS — M0549 Rheumatoid myopathy with rheumatoid arthritis of multiple sites: Secondary | ICD-10-CM | POA: Insufficient documentation

## 2016-11-14 DIAGNOSIS — F411 Generalized anxiety disorder: Secondary | ICD-10-CM | POA: Insufficient documentation

## 2016-11-14 DIAGNOSIS — I10 Essential (primary) hypertension: Secondary | ICD-10-CM | POA: Insufficient documentation

## 2016-11-14 LAB — BASIC METABOLIC PANEL
Anion gap: 8 (ref 5–15)
BUN: 19 mg/dL (ref 6–20)
CALCIUM: 8.8 mg/dL — AB (ref 8.9–10.3)
CO2: 27 mmol/L (ref 22–32)
CREATININE: 0.47 mg/dL (ref 0.44–1.00)
Chloride: 99 mmol/L — ABNORMAL LOW (ref 101–111)
GFR calc non Af Amer: >60 mL/min (ref >60)
GLUCOSE: 142 mg/dL — AB (ref 65–99)
Potassium: 3.4 mmol/L — ABNORMAL LOW (ref 3.5–5.1)
Sodium: 134 mmol/L — ABNORMAL LOW (ref 135–145)

## 2016-11-14 LAB — MAGNESIUM: MAGNESIUM: 1.4 mg/dL — AB (ref 1.7–2.4)

## 2016-11-17 ENCOUNTER — Encounter (HOSPITAL_COMMUNITY)
Admission: RE | Admit: 2016-11-17 | Discharge: 2016-11-17 | Disposition: A | Discharge status: Home / Self Care | Payer: PPO | Source: Skilled Nursing Facility | Attending: Internal Medicine | Admitting: Internal Medicine

## 2016-11-17 DIAGNOSIS — F411 Generalized anxiety disorder: Secondary | ICD-10-CM | POA: Insufficient documentation

## 2016-11-17 DIAGNOSIS — I10 Essential (primary) hypertension: Secondary | ICD-10-CM | POA: Insufficient documentation

## 2016-11-17 DIAGNOSIS — L02212 Cutaneous abscess of back [any part, except buttock]: Secondary | ICD-10-CM | POA: Insufficient documentation

## 2016-11-17 DIAGNOSIS — M0549 Rheumatoid myopathy with rheumatoid arthritis of multiple sites: Secondary | ICD-10-CM | POA: Insufficient documentation

## 2016-11-17 DIAGNOSIS — K219 Gastro-esophageal reflux disease without esophagitis: Secondary | ICD-10-CM | POA: Insufficient documentation

## 2016-11-17 LAB — BASIC METABOLIC PANEL
ANION GAP: 8 (ref 5–15)
BUN: 16 mg/dL (ref 6–20)
CALCIUM: 9.3 mg/dL (ref 8.9–10.3)
CO2: 29 mmol/L (ref 22–32)
Chloride: 97 mmol/L — ABNORMAL LOW (ref 101–111)
Creatinine, Ser: 0.4 mg/dL — ABNORMAL LOW (ref 0.44–1.00)
Glucose, Bld: 101 mg/dL — ABNORMAL HIGH (ref 65–99)
POTASSIUM: 3.8 mmol/L (ref 3.5–5.1)
Sodium: 134 mmol/L — ABNORMAL LOW (ref 135–145)

## 2016-11-21 ENCOUNTER — Encounter (HOSPITAL_COMMUNITY)
Admission: RE | Admit: 2016-11-21 | Discharge: 2016-11-21 | Disposition: A | Discharge status: Home / Self Care | Payer: PPO | Source: Skilled Nursing Facility | Attending: Internal Medicine | Admitting: Internal Medicine

## 2016-11-21 DIAGNOSIS — F411 Generalized anxiety disorder: Secondary | ICD-10-CM | POA: Insufficient documentation

## 2016-11-21 DIAGNOSIS — K219 Gastro-esophageal reflux disease without esophagitis: Secondary | ICD-10-CM | POA: Insufficient documentation

## 2016-11-21 DIAGNOSIS — L02212 Cutaneous abscess of back [any part, except buttock]: Secondary | ICD-10-CM | POA: Insufficient documentation

## 2016-11-21 DIAGNOSIS — I10 Essential (primary) hypertension: Secondary | ICD-10-CM | POA: Insufficient documentation

## 2016-11-21 DIAGNOSIS — M0549 Rheumatoid myopathy with rheumatoid arthritis of multiple sites: Secondary | ICD-10-CM | POA: Insufficient documentation

## 2016-11-21 LAB — BASIC METABOLIC PANEL
ANION GAP: 9 (ref 5–15)
BUN: 22 mg/dL — ABNORMAL HIGH (ref 6–20)
CALCIUM: 9.3 mg/dL (ref 8.9–10.3)
CHLORIDE: 96 mmol/L — AB (ref 101–111)
CO2: 29 mmol/L (ref 22–32)
Creatinine, Ser: 0.41 mg/dL — ABNORMAL LOW (ref 0.44–1.00)
GFR calc non Af Amer: >60 mL/min (ref >60)
Glucose, Bld: 60 mg/dL — ABNORMAL LOW (ref 65–99)
POTASSIUM: 3.5 mmol/L (ref 3.5–5.1)
Sodium: 134 mmol/L — ABNORMAL LOW (ref 135–145)

## 2016-11-21 LAB — MAGNESIUM: Magnesium: 1.7 mg/dL (ref 1.7–2.4)

## 2016-11-23 DIAGNOSIS — Z96641 Presence of right artificial hip joint: Secondary | ICD-10-CM | POA: Diagnosis not present

## 2016-11-23 DIAGNOSIS — Z96642 Presence of left artificial hip joint: Secondary | ICD-10-CM | POA: Diagnosis not present

## 2016-11-23 DIAGNOSIS — K219 Gastro-esophageal reflux disease without esophagitis: Secondary | ICD-10-CM | POA: Diagnosis not present

## 2016-11-23 DIAGNOSIS — M6281 Muscle weakness (generalized): Secondary | ICD-10-CM | POA: Diagnosis not present

## 2016-11-23 DIAGNOSIS — R279 Unspecified lack of coordination: Secondary | ICD-10-CM | POA: Diagnosis not present

## 2016-11-23 DIAGNOSIS — F411 Generalized anxiety disorder: Secondary | ICD-10-CM | POA: Diagnosis not present

## 2016-11-23 DIAGNOSIS — Z7952 Long term (current) use of systemic steroids: Secondary | ICD-10-CM | POA: Diagnosis not present

## 2016-11-23 DIAGNOSIS — L02212 Cutaneous abscess of back [any part, except buttock]: Secondary | ICD-10-CM | POA: Diagnosis not present

## 2016-11-23 DIAGNOSIS — I10 Essential (primary) hypertension: Secondary | ICD-10-CM | POA: Diagnosis not present

## 2016-11-23 DIAGNOSIS — R293 Abnormal posture: Secondary | ICD-10-CM | POA: Diagnosis not present

## 2016-11-23 DIAGNOSIS — Z96652 Presence of left artificial knee joint: Secondary | ICD-10-CM | POA: Diagnosis not present

## 2016-11-23 DIAGNOSIS — M0549 Rheumatoid myopathy with rheumatoid arthritis of multiple sites: Secondary | ICD-10-CM | POA: Diagnosis not present

## 2016-11-25 ENCOUNTER — Encounter: Payer: Self-pay | Admitting: Internal Medicine

## 2016-11-25 ENCOUNTER — Non-Acute Institutional Stay (SKILLED_NURSING_FACILITY): Payer: PPO | Admitting: Internal Medicine

## 2016-11-25 DIAGNOSIS — L89109 Pressure ulcer of unspecified part of back, unspecified stage: Secondary | ICD-10-CM | POA: Insufficient documentation

## 2016-11-25 DIAGNOSIS — D72829 Elevated white blood cell count, unspecified: Secondary | ICD-10-CM | POA: Insufficient documentation

## 2016-11-25 DIAGNOSIS — D72828 Other elevated white blood cell count: Secondary | ICD-10-CM | POA: Diagnosis not present

## 2016-11-25 DIAGNOSIS — M069 Rheumatoid arthritis, unspecified: Secondary | ICD-10-CM | POA: Diagnosis not present

## 2016-11-25 DIAGNOSIS — E039 Hypothyroidism, unspecified: Secondary | ICD-10-CM

## 2016-11-25 DIAGNOSIS — L89100 Pressure ulcer of unspecified part of back, unstageable: Secondary | ICD-10-CM

## 2016-11-25 HISTORY — DX: Hypothyroidism, unspecified: E03.9

## 2016-11-25 NOTE — Progress Notes (Signed)
Location:   [facility] [place] Room Number: 130/P Place of Service:  SNF (31) Provider:  Yetta Numbers, MD  Patient Care Team: Jani Gravel, MD as PCP - General (Internal Medicine)  Extended Emergency Contact Information Primary Emergency Contact: Noah Charon Address: [address]          [place], [place] [address] [place] Phone: [phone] Relation: Spouse  Code Status:  DNR Goals of care: Advanced Directive information Advanced Directives 11/25/2016  Does Patient Have a Medical Advance Directive? Yes  Type of Advance Directive Out of facility DNR (pink MOST or yellow form)  Does patient want to make changes to medical advance directive? No - Patient declined  Would patient like information on creating a medical advance directive? No - Patient declined  Pre-existing out of facility DNR order (yellow form or pink MOST form) -     Chief Complaint  Patient presents with  . Acute Visit    Wound Elvauation    HPI:  Pt is a 76 y.o. female seen today for an acute visit for Wound evaluation.  Patient has h/o Rheumatoid arthritis since she was 76 year old on Chronic Prednisone of 5 mg for past 6 months. She was initially admitted for non healing pressure wound in her back which drained purulent discharge after debridement in the Hospital. She has been in the Facility for Wound care. Initially she was made comfort care as prognosis was very poor. And since the wound was so close to the bone Surgry could not do any more debridement. Patient has done well in facility.She has stayed afebrile. Her pain has been controlled on Oxycodone and 10 mg of Prednisone. She has Silver alginate dressing every day and most of her wounds have now healed. She still has one wound in her back which has not healed. Patients weight is though unchanged at 99 lbs. Her appetite is still poor.   Past Medical History:  Diagnosis Date  . Angina   . Anxiety   .  Arthritis   . Blood transfusion   . CAP (community acquired pneumonia) 10/02/2016  . GERD (gastroesophageal reflux disease)   . Hypertension   . Myocardial infarction   . PONV (postoperative nausea and vomiting)   . Shortness of breath   . Spinal headache    Past Surgical History:  Procedure Laterality Date  . CARDIAC CATHETERIZATION    . HAND SURGERY  1980   skin infection  . JOINT REPLACEMENT    . ORIF PERIPROSTHETIC FRACTURE  09/17/2011   Procedure: OPEN REDUCTION INTERNAL FIXATION (ORIF) PERIPROSTHETIC FRACTURE;  Surgeon: Mauri Pole, MD;  Location: [facility];  Service: Orthopedics;  Laterality: Left;  . TOTAL HIP ARTHROPLASTY  Feb 97   left  . TOTAL HIP ARTHROPLASTY  aug 97   right hip  . TOTAL KNEE ARTHROPLASTY  1998   left    Allergies  Allergen Reactions  . Adult Aspirin Ec [Aspirin] Nausea Only  . Codeine Nausea Only  . Hydrocodone Hives  . Sulfa Antibiotics Other (See Comments)    unknown    Allergies as of 11/25/2016      Reactions   Adult Aspirin Ec [aspirin] Nausea Only   Codeine Nausea Only   Hydrocodone Hives   Sulfa Antibiotics Other (See Comments)   unknown      Medication List       Accurate as of 11/25/16 10:30 AM. Always use your most recent med list.  acetaminophen 500 MG tablet Commonly known as:  TYLENOL Take 500 mg by mouth every 6 (six) hours as needed.   aspirin 81 MG tablet Take 1 tablet (81 mg total) by mouth 2 (two) times daily.   CALTRATE 600+D 600-400 MG-UNIT tablet Generic drug:  Calcium Carbonate-Vitamin D Take 1 tablet by mouth daily.   cholecalciferol 1000 units tablet Commonly known as:  VITAMIN D Take 1,000 Units by mouth daily.   ferrous sulfate 325 (65 FE) MG EC tablet Take 325 mg by mouth daily with breakfast.   fish oil-omega-3 fatty acids 1000 MG capsule Take 1 g by mouth 3 (three) times daily.   folic acid 1 MG tablet Commonly known as:  FOLVITE Take 1 mg by mouth daily.   lisinopril 5 MG  tablet Commonly known as:  PRINIVIL,ZESTRIL Take 5 mg by mouth daily.   magnesium oxide 400 (241.3 Mg) MG tablet Commonly known as:  MAG-OX Take 400 mg by mouth daily.   multivitamin with minerals Tabs tablet Take 1 tablet by mouth daily.   Normal Saline Flush 0.9 % Soln Flush of pick line use 10 ml every shift   ondansetron 4 MG disintegrating tablet Commonly known as:  ZOFRAN ODT 4mg  ODT q4 hours prn nausea/vomit   oxyCODONE 5 MG/5ML solution Commonly known as:  ROXICODONE Give 2.76ml every 8 hours as needed for pain   predniSONE 5 MG tablet Commonly known as:  DELTASONE Take 10 mg by mouth daily.   PRO-STAT 101 PO Take three times a day   RISA-BID PROBIOTIC PO Give one tablet by mouth daily   thyroid 90 MG tablet Commonly known as:  ARMOUR Take 90 mg by mouth daily.   traMADol 50 MG tablet Commonly known as:  ULTRAM Take 1 tablet (50 mg total) by mouth every 6 (six) hours as needed for pain.   VENELEX Oint Apply to sacrum, bilateral buttocks and coccyx q shift & prn       Review of Systems  Review of Systems  Constitutional: Negative for activity change, appetite change, chills, diaphoresis, fatigue and fever.  HENT: Negative for mouth sores, postnasal drip, rhinorrhea, sinus pain and sore throat.   Respiratory: Negative for apnea, cough, chest tightness, shortness of breath and wheezing.   Cardiovascular: Negative for chest pain, palpitations and leg swelling.  Gastrointestinal: Negative for abdominal distention, abdominal pain, constipation, diarrhea, nausea and vomiting.  Genitourinary: Negative for dysuria and frequency. Has foley cathter. Musculoskeletal: Negative for arthralgias,  and myalgias.  Skin: Negative for rash.  Neurological: Negative for dizziness, syncope, weakness, light-headedness and numbness.  Psychiatric/Behavioral: Negative for behavioral problems, confusion and sleep disturbance.      There is no immunization history on file for  this patient. Pertinent  Health Maintenance Due  Topic Date Due  . MAMMOGRAM  06/24/1993  . COLONOSCOPY  06/24/1993  . DEXA SCAN  06/24/2008  . INFLUENZA VACCINE  07/25/2017 (Originally 03/25/2017)  . PNA vac Low Risk Adult (1 of 2 - PCV13) 07/25/2017 (Originally 06/24/2008)   No flowsheet data found. Functional Status Survey:    Vitals:   11/25/16 0956  BP: (!) 92/54  Pulse: 98  Resp: 20  Temp: 98.6 F (37 C)  TempSrc: Oral   There is no height or weight on file to calculate BMI. Physical Exam  Constitutional: She is oriented to person, place, and time. She appears cachectic.  HENT:  Head: Normocephalic.  Mouth/Throat: Oropharynx is clear and moist.  Eyes: Pupils are equal, round, and  reactive to light.  Neck: Neck supple.  Cardiovascular: Normal rate, regular rhythm and normal heart sounds.   No murmur heard. Pulmonary/Chest: Effort normal and breath sounds normal. No respiratory distress. She has no wheezes. She has no rales.  Abdominal: Soft. Bowel sounds are normal. She exhibits no distension. There is no tenderness. There is no rebound.  Musculoskeletal: She exhibits edema.  Neurological: She is alert and oriented to person, place, and time.  Skin: Skin is warm and dry.  Wound Seen with Nurse. Her all the other wounds have healed. She continues to have 2 wounds in upper back connected to each other with undermine skin. The smaller has shrink in size by 0.5 cm. The larger one has reduced by 1 cm. Continues to have undermine Skin. But base is clean and does not have any odor.Edges are clean also.  Psychiatric: She has a normal mood and affect. Her behavior is normal. Thought content normal.    Labs reviewed:  Recent Labs  10/09/16 0830  11/14/16 0700 11/17/16 0700 11/21/16 0600  NA 139  < > 134* 134* 134*  K 4.0  < > 3.4* 3.8 3.5  CL 99*  < > 99* 97* 96*  CO2 28  < > 27 29 29   GLUCOSE 58*  < > 142* 101* 60*  BUN 11  < > 19 16 22*  CREATININE 0.43*  < > 0.47  0.40* 0.41*  CALCIUM 9.1  < > 8.8* 9.3 9.3  MG 1.9  --  1.4*  --  1.7  < > = values in this interval not displayed.  Recent Labs  10/02/16 1735 11/09/16 1006 11/09/16 1405  AST 16 86* 22  ALT 10* 27 20  ALKPHOS 101 84 54  BILITOT 0.7 2.4* 0.5  PROT 4.8* 6.2* 4.5*  ALBUMIN 1.8* 3.0* 2.1*    Recent Labs  10/02/16 1735  10/09/16 0830 10/11/16 1518 11/09/16 1006  WBC 36.4*  < > 25.7* 27.7* 27.4*  NEUTROABS 34.0*  --  20.8*  --  24.0*  HGB 12.8  < > 14.0 12.9 16.3*  HCT 36.3  < > 42.3 37.5 47.2*  MCV 103.1*  < > 106.5* 103.9* 104.7*  PLT 433*  < > 355 395 415*  < > = values in this interval not displayed. Lab Results  Component Value Date   TSH 4.528 (H) 10/09/2016   No results found for: HGBA1C No results found for: CHOL, HDL, LDLCALC, LDLDIRECT, TRIG, CHOLHDL  Significant Diagnostic Results in last 30 days:  Ct Renal Stone Study  Result Date: 11/09/2016 CLINICAL DATA:  76 year old female with history of black watery stools positive for occult blood. Vomiting. EXAM: CT ABDOMEN AND PELVIS WITHOUT CONTRAST TECHNIQUE: Multidetector CT imaging of the abdomen and pelvis was performed following the standard protocol without IV contrast. COMPARISON:  No priors. FINDINGS: Lower chest: Volume loss and airspace consolidation in the left lower lobe. Small left pleural effusion. Subsegmental atelectasis in the right lower lobe. Atherosclerotic calcifications in the left anterior descending and right coronary arteries. Calcifications of the aortic valve. Hepatobiliary: No discrete cystic or solid hepatic lesions are identified on today's noncontrast CT examination. Partially calcified gallstone measuring up to 2 cm in diameter in the fundus of the gallbladder. No evidence of acute cholecystitis at this time. Pancreas: No definite pancreatic mass or peripancreatic inflammatory changes are noted on today's noncontrast CT examination. Spleen: Unremarkable. Adrenals/Urinary Tract: Multiple large  calculi in the collecting systems of the kidneys bilaterally, largest of which  fills much of the right renal pelvis and collecting system where there is a staghorn calculus which measures approximately 1.7 x 3.9 x 3.2 cm (axial image 41 of series 3 and coronal image 61 of series 6). Multiple calculi are also present in the proximal third of the left ureter in total spanning a length of 3.9 cm (coronal image 51 of series 6). This is associated with moderate proximal left hydroureteronephrosis. No definite calculi within the lumen of the urinary bladder, although much of the urinary bladder is obscured by extensive beam hardening artifact from the patient's bilateral hip arthroplasties. Multiple low-attenuation lesions in the right kidney, incompletely characterized on today's noncontrast CT examination, but likely to represent cysts, measuring up to 2.4 cm. Foley balloon catheter present within the lumen of the urinary bladder. Bilateral adrenal glands are normal in appearance. Stomach/Bowel: Unenhanced appearance of the stomach is normal. No pathologic dilatation of small bowel or colon. Numerous colonic diverticulae are noted, without surrounding inflammatory changes to suggest an acute diverticulitis at this time. Normal appendix. Vascular/Lymphatic: Aortic atherosclerosis, without definite aneurysm in the abdominal or pelvic vasculature. No lymphadenopathy noted in the abdomen or pelvis on today's noncontrast CT examination. Reproductive: Uterus is not confidently identified, likely surgically absent. Low anatomic pelvis is largely obscured by beam hardening artifact from bilateral hip arthroplasties. Ovaries are atrophic. Other: No significant volume of ascites.  No pneumoperitoneum. Musculoskeletal: Multiple chronic appearing compression fractures are noted in the thorax the lumbar spine, involving T8, T9, T10, T12, L1, L3 and L4. These are most severe at T8 where there is complete collapse and vertebra plana  appearance. Status post bilateral total hip arthroplasty. Old healed left inferior pubic ramus fracture. IMPRESSION: 1. Colonic diverticulosis. No findings to suggest an acute diverticulitis at this time. 2. Numerous large calculi in the collecting systems of the kidneys bilaterally, including a staghorn calculus in the right renal pelvis and collecting system which measures 1.7 x 3.9 x 3.2 cm. In addition, there is a cluster of multiple large calculi in the proximal third of the left ureter which span a total length of 3.9 cm and are associated with moderate left hydroureteronephrosis indicative of obstruction. 3. Combination of atelectasis and airspace consolidation in the left lower lobe concerning for pneumonia. Small left parapneumonic pleural effusion. 4. Aortic atherosclerosis, in addition to 2 vessel coronary artery disease. Please note that although the presence of coronary artery calcium documents the presence of coronary artery disease, the severity of this disease and any potential stenosis cannot be assessed on this non-gated CT examination. Assessment for potential risk factor modification, dietary therapy or pharmacologic therapy may be warranted, if clinically indicated. 5. There are calcifications of the aortic valve. Echocardiographic correlation for evaluation of potential valvular dysfunction may be warranted if clinically indicated. 6. Multiple old thoracolumbar vertebral body compression fractures, as above. 7. Additional incidental findings, as above. Electronically Signed   By: Vinnie Langton M.D.   On: 11/09/2016 13:09    Assessment/Plan Decubitus ulcer of Upper back The wound has done remarkable well. But at this time not sure if her skin will grow back to cover the wound. I had to discuss her Clinical situation with Doctors for her Insurance to make decision how long can patient stay in the SNF. Per her Insurance  There is need for another opinion.  D/W Husband and patient will  send patient to Wound care clinic for another opinion.  Rheumatoid arthritis involving multiple sites,  Patient is doing well with pain  controlled on Oxycodone and higher dose of Prednisone. Will continue same for now.  Elevated WBC Repeat Labs   Per patient request will also have Therapy evaluate her again to see if they can work with her. Also will try to see if patient can tolerate to sit in the [facility]. Will also Discontinue her Foley Cathter as her sacral wounds have now healed. Her prognosis is still guarded.    Family/ staff Communication:   Labs/tests ordered:  CBC, CMP Total time spent in this patient care encounter was 45_ minutes; greater than 50% of the visit spent counseling patient and coordinating care for problems addressed at this encounter.

## 2016-11-26 ENCOUNTER — Encounter (HOSPITAL_COMMUNITY)
Admission: RE | Admit: 2016-11-26 | Discharge: 2016-11-26 | Disposition: A | Discharge status: Home / Self Care | Payer: PPO | Source: Skilled Nursing Facility | Attending: Internal Medicine | Admitting: Internal Medicine

## 2016-11-26 DIAGNOSIS — K219 Gastro-esophageal reflux disease without esophagitis: Secondary | ICD-10-CM | POA: Insufficient documentation

## 2016-11-26 DIAGNOSIS — F411 Generalized anxiety disorder: Secondary | ICD-10-CM | POA: Insufficient documentation

## 2016-11-26 DIAGNOSIS — I10 Essential (primary) hypertension: Secondary | ICD-10-CM | POA: Insufficient documentation

## 2016-11-26 LAB — COMPREHENSIVE METABOLIC PANEL
ALBUMIN: 2.5 g/dL — AB (ref 3.5–5.0)
ALT: 20 U/L (ref 14–54)
AST: 35 U/L (ref 15–41)
Alkaline Phosphatase: 66 U/L (ref 38–126)
Anion gap: 12 (ref 5–15)
BUN: 25 mg/dL — ABNORMAL HIGH (ref 6–20)
CHLORIDE: 96 mmol/L — AB (ref 101–111)
CO2: 27 mmol/L (ref 22–32)
CREATININE: 0.52 mg/dL (ref 0.44–1.00)
Calcium: 9.1 mg/dL (ref 8.9–10.3)
GFR calc non Af Amer: >60 mL/min (ref >60)
GLUCOSE: 154 mg/dL — AB (ref 65–99)
Potassium: 3.3 mmol/L — ABNORMAL LOW (ref 3.5–5.1)
SODIUM: 135 mmol/L (ref 135–145)
Total Bilirubin: 0.4 mg/dL (ref 0.3–1.2)
Total Protein: 5.5 g/dL — ABNORMAL LOW (ref 6.5–8.1)

## 2016-11-26 LAB — CBC WITH DIFFERENTIAL/PLATELET
BASOS ABS: 0.1 10*3/uL (ref 0.0–0.1)
BASOS PCT: 0 %
EOS ABS: 0.3 10*3/uL (ref 0.0–0.7)
EOS PCT: 1 %
HCT: 40.1 % (ref 36.0–46.0)
Hemoglobin: 13.5 g/dL (ref 12.0–15.0)
Lymphocytes Relative: 7 %
Lymphs Abs: 1.3 10*3/uL (ref 0.7–4.0)
MCH: 34.4 pg — AB (ref 26.0–34.0)
MCHC: 33.7 g/dL (ref 30.0–36.0)
MCV: 102 fL — ABNORMAL HIGH (ref 78.0–100.0)
Monocytes Absolute: 1.6 10*3/uL — ABNORMAL HIGH (ref 0.1–1.0)
Monocytes Relative: 9 %
NEUTROS PCT: 83 %
Neutro Abs: 14.1 10*3/uL — ABNORMAL HIGH (ref 1.7–7.7)
PLATELETS: 434 10*3/uL — AB (ref 150–400)
RBC: 3.93 MIL/uL (ref 3.87–5.11)
RDW: 12.6 % (ref 11.5–15.5)
WBC: 17.3 10*3/uL — AB (ref 4.0–10.5)

## 2016-11-28 ENCOUNTER — Encounter (HOSPITAL_COMMUNITY)
Admission: AD | Admit: 2016-11-28 | Discharge: 2016-11-28 | Disposition: A | Discharge status: Home / Self Care | Payer: PPO | Source: Skilled Nursing Facility | Attending: *Deleted | Admitting: *Deleted

## 2016-11-28 LAB — BASIC METABOLIC PANEL
Anion gap: 7 (ref 5–15)
BUN: 24 mg/dL — ABNORMAL HIGH (ref 6–20)
CO2: 29 mmol/L (ref 22–32)
Calcium: 9.2 mg/dL (ref 8.9–10.3)
Chloride: 99 mmol/L — ABNORMAL LOW (ref 101–111)
Creatinine, Ser: 0.32 mg/dL — ABNORMAL LOW (ref 0.44–1.00)
GFR calc non Af Amer: >60 mL/min (ref >60)
Glucose, Bld: 85 mg/dL (ref 65–99)
POTASSIUM: 3.6 mmol/L (ref 3.5–5.1)
Sodium: 135 mmol/L (ref 135–145)

## 2016-12-02 NOTE — Progress Notes (Signed)
This encounter was created in error - please disregard.

## 2016-12-05 ENCOUNTER — Encounter (HOSPITAL_COMMUNITY)
Admission: RE | Admit: 2016-12-05 | Discharge: 2016-12-05 | Disposition: A | Discharge status: Home / Self Care | Payer: PPO | Source: Skilled Nursing Facility | Attending: *Deleted | Admitting: *Deleted

## 2016-12-05 LAB — BASIC METABOLIC PANEL
ANION GAP: 8 (ref 5–15)
BUN: 24 mg/dL — ABNORMAL HIGH (ref 6–20)
CALCIUM: 9.1 mg/dL (ref 8.9–10.3)
CO2: 28 mmol/L (ref 22–32)
CREATININE: 0.44 mg/dL (ref 0.44–1.00)
Chloride: 96 mmol/L — ABNORMAL LOW (ref 101–111)
GFR calc non Af Amer: >60 mL/min (ref >60)
Glucose, Bld: 103 mg/dL — ABNORMAL HIGH (ref 65–99)
Potassium: 3.8 mmol/L (ref 3.5–5.1)
Sodium: 132 mmol/L — ABNORMAL LOW (ref 135–145)

## 2016-12-09 ENCOUNTER — Other Ambulatory Visit: Payer: Self-pay | Admitting: *Deleted

## 2016-12-09 MED ORDER — OXYCODONE HCL 5 MG/5ML PO SOLN: ORAL | 0 refills | Status: DC

## 2016-12-09 NOTE — Telephone Encounter (Signed)
[facility] #[phone] Fax: [phone]   

## 2016-12-11 ENCOUNTER — Encounter (HOSPITAL_BASED_OUTPATIENT_CLINIC_OR_DEPARTMENT_OTHER): Payer: PPO

## 2016-12-12 ENCOUNTER — Encounter (HOSPITAL_COMMUNITY)
Admission: RE | Admit: 2016-12-12 | Discharge: 2016-12-12 | Disposition: A | Discharge status: Home / Self Care | Payer: PPO | Source: Skilled Nursing Facility | Attending: Internal Medicine | Admitting: Internal Medicine

## 2016-12-12 DIAGNOSIS — K219 Gastro-esophageal reflux disease without esophagitis: Secondary | ICD-10-CM | POA: Insufficient documentation

## 2016-12-12 DIAGNOSIS — E876 Hypokalemia: Secondary | ICD-10-CM | POA: Insufficient documentation

## 2016-12-12 DIAGNOSIS — F411 Generalized anxiety disorder: Secondary | ICD-10-CM | POA: Insufficient documentation

## 2016-12-12 DIAGNOSIS — I10 Essential (primary) hypertension: Secondary | ICD-10-CM | POA: Insufficient documentation

## 2016-12-12 DIAGNOSIS — L02212 Cutaneous abscess of back [any part, except buttock]: Secondary | ICD-10-CM | POA: Insufficient documentation

## 2016-12-12 DIAGNOSIS — M0549 Rheumatoid myopathy with rheumatoid arthritis of multiple sites: Secondary | ICD-10-CM | POA: Insufficient documentation

## 2016-12-17 ENCOUNTER — Non-Acute Institutional Stay (SKILLED_NURSING_FACILITY): Payer: PPO | Admitting: Internal Medicine

## 2016-12-17 ENCOUNTER — Encounter: Payer: Self-pay | Admitting: Internal Medicine

## 2016-12-17 DIAGNOSIS — M069 Rheumatoid arthritis, unspecified: Secondary | ICD-10-CM | POA: Diagnosis not present

## 2016-12-17 DIAGNOSIS — D72828 Other elevated white blood cell count: Secondary | ICD-10-CM | POA: Diagnosis not present

## 2016-12-17 DIAGNOSIS — E876 Hypokalemia: Secondary | ICD-10-CM

## 2016-12-17 DIAGNOSIS — L02212 Cutaneous abscess of back [any part, except buttock]: Secondary | ICD-10-CM | POA: Diagnosis not present

## 2016-12-17 DIAGNOSIS — I10 Essential (primary) hypertension: Secondary | ICD-10-CM | POA: Diagnosis not present

## 2016-12-17 DIAGNOSIS — D509 Iron deficiency anemia, unspecified: Secondary | ICD-10-CM

## 2016-12-17 NOTE — Progress Notes (Signed)
Location:   [facility] Room Number: 130/P Place of Service:  SNF (31) Provider:  Lucretia Field, MD  Patient Care Team: Jani Gravel, MD as PCP - General (Internal Medicine)  Extended Emergency Contact Information Primary Emergency Contact: Noah Charon Address: [address]          [place], [place] [address] [place] Phone: [phone] Relation: Spouse  Code Status:  DNR Goals of care: Advanced Directive information Advanced Directives 12/17/2016  Does Patient Have a Medical Advance Directive? Yes  Type of Advance Directive Out of facility DNR (pink MOST or yellow form)  Does patient want to make changes to medical advance directive? No - Patient declined  Would patient like information on creating a medical advance directive? No - Patient declined  Pre-existing out of facility DNR order (yellow form or pink MOST form) -     Chief Complaint  Patient presents with  . Medical Management of Chronic Issues    Routine Visit  For medical management of chronic medical conditions including rheumatoid arthritis-history of back wounds-with leukocytosis-hypertension-anemia-hypothyroidism-osteoporosis-hypokalemia  HPI:  Pt is a 76 y.o. female seen today for medical management of chronic diseases. She has a very complicated medical history ears he came here for therapy after hospitalization for pneumonia and a back wound with sepsis-she did at [facility] times receive antibiotics she had a significantly elevated white count.  She was evaluated by surgery but thought there could be no further debridement-at one point plan was for hospice care once she is discharged from facility.  However she appears to have rebounded somewhat actually the wounds have improved and now she basically has the back wounds which are the only ones not resolved-her white count has come down to 17,000 she is afebrile.  Appears to be comfortable although again she  still is in a very fragile state.  Regards to rheumatoid arthritis this has been significant she's had this apparently since she was a teenager--and has diffuse pain however this has responded initially to a tapering course of prednisone-she is now on 10 mg of prednisone also has tramadol as needed for moderate pain and oxycodone for severe pain she still has some discomfort with movement at times but this is considerably improved it appears.  She also has a history of anemia and is on iron this appears to be relatively stable with a hemoglobin 13.5 on lab done earlier this month.  She also has a history hypokalemia she is on magnesium with potassium supplementation of 20 MG daily most recent potassium was 3.8 on lab done on 12/05/2016.  Regards to hypertension this appears relatively stable recent blood pressures 118/61-115/62 she is on low dose lisinopril.  She also has a history hypothyroidism TSH 10/09/2016 was 4.528.  She is on Armour thyroid.  Currently she is resting in bed comfortably-still has some discomfort at times with certain areas are palpated-appears to be in good spirits but again and quite fragile status   Past Medical History:  Diagnosis Date  . Angina   . Anxiety   . Arthritis   . Blood transfusion   . CAP (community acquired pneumonia) 10/02/2016  . GERD (gastroesophageal reflux disease)   . Hypertension   . Hypothyroid 11/25/2016  . Myocardial infarction ([place])   . PONV (postoperative nausea and vomiting)   . Shortness of breath   . Spinal headache    Past Surgical History:  Procedure Laterality Date  . CARDIAC CATHETERIZATION    .  HAND SURGERY  1980   skin infection  . JOINT REPLACEMENT    . ORIF PERIPROSTHETIC FRACTURE  09/17/2011   Procedure: OPEN REDUCTION INTERNAL FIXATION (ORIF) PERIPROSTHETIC FRACTURE;  Surgeon: Mauri Pole, MD;  Location: [facility];  Service: Orthopedics;  Laterality: Left;  . TOTAL HIP ARTHROPLASTY  Feb 97   left  . TOTAL HIP  ARTHROPLASTY  aug 97   right hip  . TOTAL KNEE ARTHROPLASTY  1998   left    Allergies  Allergen Reactions  . Adult Aspirin Ec [Aspirin] Nausea Only  . Codeine Nausea Only  . Hydrocodone Hives  . Sulfa Antibiotics Other (See Comments)    unknown    Allergies as of 12/17/2016      Reactions   Adult Aspirin Ec [aspirin] Nausea Only   Codeine Nausea Only   Hydrocodone Hives   Sulfa Antibiotics Other (See Comments)   unknown      Medication List       Accurate as of 12/17/16  4:18 PM. Always use your most recent med list.          acetaminophen 500 MG tablet Commonly known as:  TYLENOL Take 500 mg by mouth every 6 (six) hours as needed.   aspirin 81 MG tablet Take 1 tablet (81 mg total) by mouth 2 (two) times daily.   CALTRATE 600+D 600-400 MG-UNIT tablet Generic drug:  Calcium Carbonate-Vitamin D Take 1 tablet by mouth daily.   cholecalciferol 1000 units tablet Commonly known as:  VITAMIN D Take 1,000 Units by mouth daily.   ferrous sulfate 325 (65 FE) MG EC tablet Take 325 mg by mouth daily with breakfast.   fish oil-omega-3 fatty acids 1000 MG capsule Take 1 g by mouth 3 (three) times daily.   folic acid 1 MG tablet Commonly known as:  FOLVITE Take 1 mg by mouth daily.   heparin flush (porcine) 100 UNIT/ML injection 100 Units by Intracatheter route daily.   lisinopril 5 MG tablet Commonly known as:  PRINIVIL,ZESTRIL Take 5 mg by mouth daily.   magnesium oxide 400 (241.3 Mg) MG tablet Commonly known as:  MAG-OX Take 400 mg by mouth daily.   multivitamin with minerals Tabs tablet Take 1 tablet by mouth daily.   Normal Saline Flush 0.9 % Soln Flush of pick line use 10 ml every shift   omeprazole 20 MG capsule Commonly known as:  PRILOSEC Take 20 mg by mouth daily.   ondansetron 4 MG disintegrating tablet Commonly known as:  ZOFRAN ODT 4mg  ODT q4 hours prn nausea/vomit   oxyCODONE 5 MG/5ML solution Commonly known as:  ROXICODONE Give 2.110ml  every 6 hours as needed for severe pain   potassium chloride SA 20 MEQ tablet Commonly known as:  K-DUR,KLOR-CON Take 20 mEq by mouth daily.   predniSONE 5 MG tablet Commonly known as:  DELTASONE Take 10 mg by mouth daily.   PRO-STAT 101 PO Take three times a day   RISA-BID PROBIOTIC PO Give one tablet by mouth daily   thyroid 90 MG tablet Commonly known as:  ARMOUR Take 90 mg by mouth daily.   traMADol 50 MG tablet Commonly known as:  ULTRAM Take 1 tablet (50 mg total) by mouth every 6 (six) hours as needed for pain.   VENELEX Oint Apply to sacrum, bilateral buttocks and coccyx q shift & prn       Review of Systems In general no complaints of fever or chills.  Skin does have very fragile skin  with some chronic bruising. Has a back wound would still persists but apparently has improved per wound care and nursing  Head ears eyes nose mouth and throat does not complain of visual changes or sore throat.  Respiratory is not complaining of shortness breath or cough currently.  Cardiac does not complain of chest pain or lower extremity edema does have history coronary artery disease.  GI is not complaining of any nausea vomiting diarrhea constipation or acute abdominal pain.  GU does not complain of dysuria has been treated past for UTI.  Muscle-l skeletal does have a history of significant rheumatoid arthritis at this point pain appears to be relatively controlled although at times will have breakthrough pain. Has had at times significant neck pain and joint pain  Neurologic does not complain of dizziness headache does have significant weakness  There is no immunization history on file for this patient. Pertinent  Health Maintenance Due  Topic Date Due  . MAMMOGRAM  06/24/1993  . COLONOSCOPY  06/24/1993  . DEXA SCAN  06/24/2008  . INFLUENZA VACCINE  07/25/2017 (Originally 03/25/2017)  . PNA vac Low Risk Adult (1 of 2 - PCV13) 07/25/2017 (Originally 06/24/2008)   No  flowsheet data found. Functional Status Survey:   Temperature 97.0 pulse 71 respirations 20 blood pressure 118/61 weight has slowly increased up to 103.4   Physical Exam  In general this is a very frail pale female who does not appear to be in any distress lying comfortably in bed.  Her skin is warm and dry she does have a PICC line right upper arm also has some Steri-Strips applied to her right arm where apparently there've been skin tears there still is some bruising visible I do not really see sign of infection drainage bleeding at this time.  Eyes pupils appear reactive to light sclera and conjunctivae are relatively clear she does have some darkened areas on her conjunctiva the left lateral eye.  Oropharynx clear mucous membranes moist.  Chest shallow air entry but could not really appreciate any labored breathing or congestion.  Heart is regular rate and rhythm without murmur gallop or rub.  Abdomen soft does not appear to be tender there are positive bowel sounds.  Muscle skeletal has arthritic changes with some contractures of her upper extremities hands-there is still some tenderness to palpation at times of her hands-she has significant weakness of her lower extremities bilaterally has protective boots on.  Neurologic is grossly intact her speech is clear no lateralizing findings.  H in regards to her wounds again numerous wounds appear to have healed she has 2 wounds on her upper back that are connected with each other these are currently covered.  Have been followed by wound care as well as Dr. Lyndel Safe.  Psych she is alert and oriented pleasant and appropriate   Labs reviewed:  Recent Labs  10/09/16 0830  11/14/16 0700  11/21/16 0600 11/26/16 0700 11/28/16 0700 12/05/16 1126  NA 139  < > 134*  < > 134* 135 135 132*  K 4.0  < > 3.4*  < > 3.5 3.3* 3.6 3.8  CL 99*  < > 99*  < > 96* 96* 99* 96*  CO2 28  < > 27  < > 29 27 29 28   GLUCOSE 58*  < > 142*  < > 60* 154*  85 103*  BUN 11  < > 19  < > 22* 25* 24* 24*  CREATININE 0.43*  < > 0.47  < >  0.41* 0.52 0.32* 0.44  CALCIUM 9.1  < > 8.8*  < > 9.3 9.1 9.2 9.1  MG 1.9  --  1.4*  --  1.7  --   --   --   < > = values in this interval not displayed.  Recent Labs  11/09/16 1006 11/09/16 1405 11/26/16 0700  AST 86* 22 35  ALT 27 20 20   ALKPHOS 84 54 66  BILITOT 2.4* 0.5 0.4  PROT 6.2* 4.5* 5.5*  ALBUMIN 3.0* 2.1* 2.5*    Recent Labs  10/09/16 0830 10/11/16 1518 11/09/16 1006 11/26/16 0700  WBC 25.7* 27.7* 27.4* 17.3*  NEUTROABS 20.8*  --  24.0* 14.1*  HGB 14.0 12.9 16.3* 13.5  HCT 42.3 37.5 47.2* 40.1  MCV 106.5* 103.9* 104.7* 102.0*  PLT 355 395 415* 434*   Lab Results  Component Value Date   TSH 4.528 (H) 10/09/2016   No results found for: HGBA1C No results found for: CHOL, HDL, LDLCALC, LDLDIRECT, TRIG, CHOLHDL  Significant Diagnostic Results in last 30 days:  No results found.  Assessment/Plan  :  #1 history of wounds-these apparently have improved-Dr. Lyndel Safe has addressed this with her insurance company and insurance expressed desires to get a second opinion in deciding how long patient' can stay stay in skilled nursing-there have been attempts to get an outside wound care consult however transportation has been an issue since she needs apparently EMS transportation because of her condition-at this point her husband has deferred getting the outside consult because of these issues.  At this point will be monitored by wound care and Dr. Lyndel Safe.  #2 history of rheumatoid arthritis this appears under somewhat better control on routine prednisone as well as when necessary tramadol and oxycodone.  #3 hypertension this appears stable on lisinopril 5 mg a day.  #4 anemia thought to have an element of iron deficiency she is on iron hemoglobin is 13.5 on lab done earlier this month we will update this.  #5 leukocytosis white count is coming down which is encouraging most recently  17,300-will update this as well.  #6 history of hypothyroidism TSH in February was 4.528 she is on Armour Thyroid.  #7-history of hypokalemia she is on potassium and magnesium supplementation again will update a metabolic panel to ensure stability last potassium was 3.8 earlier this month.  Number 8- history of osteoporosis she is on calcium and vitamin D.  #Coronary artery disease he is on aspirin twice a day appears relatively asymptomatic does not really complain of chest pain.  #10 history of weight loss-her weight appears to be slowly trending up which is encouraging she is on supplements.  Again will update a CBC and BMP--.  [id number] note greater than 45 minutes spent assessing patient-reviewing her chart-reviewing her labs-and coordinating and formulating a plan of care for numerous diagnoses-of note greater than 50% of time spent coordinating plan of care

## 2016-12-18 ENCOUNTER — Other Ambulatory Visit (HOSPITAL_COMMUNITY)
Admission: RE | Admit: 2016-12-18 | Discharge: 2016-12-18 | Disposition: A | Discharge status: Home / Self Care | Payer: PPO | Source: Skilled Nursing Facility | Attending: Internal Medicine | Admitting: Internal Medicine

## 2016-12-18 DIAGNOSIS — E876 Hypokalemia: Secondary | ICD-10-CM | POA: Insufficient documentation

## 2016-12-18 LAB — CBC WITH DIFFERENTIAL/PLATELET
Basophils Absolute: 0.1 10*3/uL (ref 0.0–0.1)
Basophils Relative: 0 %
EOS ABS: 0.4 10*3/uL (ref 0.0–0.7)
EOS PCT: 2 %
HCT: 43.7 % (ref 36.0–46.0)
HEMOGLOBIN: 14.3 g/dL (ref 12.0–15.0)
Lymphocytes Relative: 11 %
Lymphs Abs: 2 10*3/uL (ref 0.7–4.0)
MCH: 32.9 pg (ref 26.0–34.0)
MCHC: 32.7 g/dL (ref 30.0–36.0)
MCV: 100.5 fL — ABNORMAL HIGH (ref 78.0–100.0)
Monocytes Absolute: 1.4 10*3/uL — ABNORMAL HIGH (ref 0.1–1.0)
Monocytes Relative: 8 %
NEUTROS PCT: 79 %
Neutro Abs: 14.2 10*3/uL — ABNORMAL HIGH (ref 1.7–7.7)
PLATELETS: 452 10*3/uL — AB (ref 150–400)
RBC: 4.35 MIL/uL (ref 3.87–5.11)
RDW: 12.9 % (ref 11.5–15.5)
WBC: 18 10*3/uL — AB (ref 4.0–10.5)

## 2016-12-18 LAB — BASIC METABOLIC PANEL
Anion gap: 11 (ref 5–15)
BUN: 15 mg/dL (ref 6–20)
CO2: 28 mmol/L (ref 22–32)
CREATININE: 0.36 mg/dL — AB (ref 0.44–1.00)
Calcium: 9.2 mg/dL (ref 8.9–10.3)
Chloride: 96 mmol/L — ABNORMAL LOW (ref 101–111)
Glucose, Bld: 62 mg/dL — ABNORMAL LOW (ref 65–99)
Potassium: 3.6 mmol/L (ref 3.5–5.1)
Sodium: 135 mmol/L (ref 135–145)

## 2016-12-23 DIAGNOSIS — M79674 Pain in right toe(s): Secondary | ICD-10-CM | POA: Diagnosis not present

## 2016-12-23 DIAGNOSIS — K219 Gastro-esophageal reflux disease without esophagitis: Secondary | ICD-10-CM | POA: Diagnosis not present

## 2016-12-23 DIAGNOSIS — Z96652 Presence of left artificial knee joint: Secondary | ICD-10-CM | POA: Diagnosis not present

## 2016-12-23 DIAGNOSIS — F411 Generalized anxiety disorder: Secondary | ICD-10-CM | POA: Diagnosis not present

## 2016-12-23 DIAGNOSIS — B353 Tinea pedis: Secondary | ICD-10-CM | POA: Diagnosis not present

## 2016-12-23 DIAGNOSIS — Z96641 Presence of right artificial hip joint: Secondary | ICD-10-CM | POA: Diagnosis not present

## 2016-12-23 DIAGNOSIS — L02212 Cutaneous abscess of back [any part, except buttock]: Secondary | ICD-10-CM | POA: Diagnosis not present

## 2016-12-23 DIAGNOSIS — R293 Abnormal posture: Secondary | ICD-10-CM | POA: Diagnosis not present

## 2016-12-23 DIAGNOSIS — I10 Essential (primary) hypertension: Secondary | ICD-10-CM | POA: Diagnosis not present

## 2016-12-23 DIAGNOSIS — M79675 Pain in left toe(s): Secondary | ICD-10-CM | POA: Diagnosis not present

## 2016-12-23 DIAGNOSIS — Z96642 Presence of left artificial hip joint: Secondary | ICD-10-CM | POA: Diagnosis not present

## 2016-12-23 DIAGNOSIS — D72828 Other elevated white blood cell count: Secondary | ICD-10-CM | POA: Diagnosis not present

## 2016-12-23 DIAGNOSIS — E039 Hypothyroidism, unspecified: Secondary | ICD-10-CM | POA: Diagnosis not present

## 2016-12-23 DIAGNOSIS — B351 Tinea unguium: Secondary | ICD-10-CM | POA: Diagnosis not present

## 2016-12-23 DIAGNOSIS — M6281 Muscle weakness (generalized): Secondary | ICD-10-CM | POA: Diagnosis not present

## 2016-12-23 DIAGNOSIS — R279 Unspecified lack of coordination: Secondary | ICD-10-CM | POA: Diagnosis not present

## 2016-12-23 DIAGNOSIS — Z7952 Long term (current) use of systemic steroids: Secondary | ICD-10-CM | POA: Diagnosis not present

## 2016-12-23 DIAGNOSIS — M069 Rheumatoid arthritis, unspecified: Secondary | ICD-10-CM | POA: Diagnosis not present

## 2016-12-23 DIAGNOSIS — M0549 Rheumatoid myopathy with rheumatoid arthritis of multiple sites: Secondary | ICD-10-CM | POA: Diagnosis not present

## 2016-12-24 ENCOUNTER — Other Ambulatory Visit: Payer: Self-pay | Admitting: *Deleted

## 2016-12-24 MED ORDER — TRAMADOL HCL 50 MG PO TABS: ORAL_TABLET | ORAL | 5 refills | Status: AC

## 2016-12-24 NOTE — Telephone Encounter (Signed)
[facility] #[phone] Fax: [phone]   

## 2016-12-30 DIAGNOSIS — M79674 Pain in right toe(s): Secondary | ICD-10-CM | POA: Diagnosis not present

## 2016-12-30 DIAGNOSIS — M79675 Pain in left toe(s): Secondary | ICD-10-CM | POA: Diagnosis not present

## 2016-12-30 DIAGNOSIS — B351 Tinea unguium: Secondary | ICD-10-CM | POA: Diagnosis not present

## 2016-12-30 DIAGNOSIS — B353 Tinea pedis: Secondary | ICD-10-CM | POA: Diagnosis not present

## 2017-01-02 ENCOUNTER — Other Ambulatory Visit: Payer: Self-pay

## 2017-01-02 MED ORDER — OXYCODONE HCL 5 MG/5ML PO SOLN: ORAL | 0 refills | Status: AC

## 2017-01-02 NOTE — Telephone Encounter (Signed)
RX sent to [facility] FAX [phone], Phone [phone]  

## 2017-01-06 ENCOUNTER — Encounter: Payer: Self-pay | Admitting: Internal Medicine

## 2017-01-06 ENCOUNTER — Non-Acute Institutional Stay (SKILLED_NURSING_FACILITY): Payer: PPO | Admitting: Internal Medicine

## 2017-01-06 DIAGNOSIS — D72828 Other elevated white blood cell count: Secondary | ICD-10-CM | POA: Diagnosis not present

## 2017-01-06 DIAGNOSIS — L02212 Cutaneous abscess of back [any part, except buttock]: Secondary | ICD-10-CM | POA: Diagnosis not present

## 2017-01-06 DIAGNOSIS — E039 Hypothyroidism, unspecified: Secondary | ICD-10-CM | POA: Diagnosis not present

## 2017-01-06 DIAGNOSIS — M069 Rheumatoid arthritis, unspecified: Secondary | ICD-10-CM

## 2017-01-06 NOTE — Progress Notes (Signed)
Location:   [facility] Room Number: 130/P Place of Service:  SNF (31)  Provider: Granville Lewis  PCP: Jani Gravel, MD Patient Care Team: Jani Gravel, MD as PCP - General (Internal Medicine)  Extended Emergency Contact Information Primary Emergency Contact: Noah Charon Address: [address]          [place], [place] [address] [place] Phone: [phone] Relation: Spouse  Code Status: DNR Goals of care:  Advanced Directive information Advanced Directives 01/06/2017  Does Patient Have a Medical Advance Directive? Yes  Type of Advance Directive Out of facility DNR (pink MOST or yellow form)  Does patient want to make changes to medical advance directive? No - Patient declined  Would patient like information on creating a medical advance directive? No - Patient declined  Pre-existing out of facility DNR order (yellow form or pink MOST form) -     Allergies  Allergen Reactions  . Adult Aspirin Ec [Aspirin] Nausea Only  . Codeine Nausea Only  . Hydrocodone Hives  . Sulfa Antibiotics Other (See Comments)    unknown    Chief Complaint  Patient presents with  . Discharge Note    HPI:  76 y.o. female  seen today for discharge from facility tomorrow.  She has a complicated history with history of rheumatoid arthritis back wounds leukocytosis hypertension anemia hypothyroidism osteoporosis of hypokalemia.  She initially presented here for therapy have to hospitalization for pneumonia and a back wound with sepsis she did receive at times numerous antibiotics secondary to significant elevated white count.   She was evaluated by surgery but thought there could be no further debridement-at one point plan was for hospice care once she is discharged from facility.  However she appears to have rebounded somewhat actually the wounds have improved and now she basically has the back wounds which are the only ones not resolved-her white count has  come down to 18,000 she is afebrile.  Regards to rheumatoid arthritis this has been significant she's had this apparently since she was a teenager--and has diffuse pain however this has responded initially to a tapering course of prednisone-she is now on 10 mg of prednisone also has tramadol as needed for moderate pain and oxycodone for severe pain she still has some discomfort with movement at times but this appears to be under improved control  She also has a history of anemia and is on iron this appears to be relatively stable with a hemoglobin 14.3 on lab done on 12/18/2016  She also has a history hypokalemia she is on magnesium with potassium supplementation of 20 MG daily most recent potassium was 3.6 on lab done again in late April.  Regards to hypertension this appears somewhat variable I got him manual reading of 132/68 today as he had previous listed 1 today of 91/56-also see 112/71 she does not appear to be symptomatic of any hypotension again she does not really move very much largely has stayed in bed  She also has a history hypothyroidism TSH 10/09/2016 was 4.528.    She is on Armour thyroid.  [facility] [place] she will be under hospice services of [place].  She does require hospital bed in her mattress that will be provided by hospice.       Past Medical History:  Diagnosis Date  . Angina   . Anxiety   . Arthritis   . Blood transfusion   .  CAP (community acquired pneumonia) 10/02/2016  . GERD (gastroesophageal reflux disease)   . Hypertension   . Hypothyroid 11/25/2016  . Myocardial infarction ([place])   . PONV (postoperative nausea and vomiting)   . Shortness of breath   . Spinal headache     Past Surgical History:  Procedure Laterality Date  . CARDIAC CATHETERIZATION    . HAND SURGERY  1980   skin infection  . JOINT REPLACEMENT    . ORIF PERIPROSTHETIC FRACTURE  09/17/2011   Procedure: OPEN  REDUCTION INTERNAL FIXATION (ORIF) PERIPROSTHETIC FRACTURE;  Surgeon: Mauri Pole, MD;  Location: [facility];  Service: Orthopedics;  Laterality: Left;  . TOTAL HIP ARTHROPLASTY  Feb 97   left  . TOTAL HIP ARTHROPLASTY  aug 97   right hip  . TOTAL KNEE ARTHROPLASTY  1998   left      reports that she has never smoked. She has never used smokeless tobacco. She reports that she does not drink alcohol or use drugs. Social History   Social History  . Marital status: Married    Spouse name: N/A  . Number of children: N/A  . Years of education: N/A   Occupational History  . Not on file.   Social History Main Topics  . Smoking status: Never Smoker  . Smokeless tobacco: Never Used  . Alcohol use No  . Drug use: No  . Sexual activity: No   Other Topics Concern  . Not on file   Social History Narrative  . No narrative on file   Functional Status Survey:    Allergies  Allergen Reactions  . Adult Aspirin Ec [Aspirin] Nausea Only  . Codeine Nausea Only  . Hydrocodone Hives  . Sulfa Antibiotics Other (See Comments)    unknown    Pertinent  Health Maintenance Due  Topic Date Due  . MAMMOGRAM  06/24/1993  . COLONOSCOPY  06/24/1993  . DEXA SCAN  06/24/2008  . INFLUENZA VACCINE  07/25/2017 (Originally 03/25/2017)  . PNA vac Low Risk Adult (1 of 2 - PCV13) 07/25/2017 (Originally 06/24/2008)    Medications: Allergies as of 01/06/2017      Reactions   Adult Aspirin Ec [aspirin] Nausea Only   Codeine Nausea Only   Hydrocodone Hives   Sulfa Antibiotics Other (See Comments)   unknown      Medication List       Accurate as of 01/06/17 11:42 AM. Always use your most recent med list.          acetaminophen 500 MG tablet Commonly known as:  TYLENOL Take 500 mg by mouth every 6 (six) hours as needed.   aspirin 81 MG tablet Take 1 tablet (81 mg total) by mouth 2 (two) times daily.   CALTRATE 600+D 600-400 MG-UNIT tablet Generic drug:  Calcium Carbonate-Vitamin D Take 1  tablet by mouth daily.   cholecalciferol 1000 units tablet Commonly known as:  VITAMIN D Take 1,000 Units by mouth daily.   feeding supplement (PRO-STAT SUGAR FREE 64) Liqd Take 30 mLs by mouth daily.   ferrous sulfate 325 (65 FE) MG EC tablet Take 325 mg by mouth daily with breakfast.   fish oil-omega-3 fatty acids 1000 MG capsule Take 1 g by mouth 3 (three) times daily.   folic acid 1 MG tablet Commonly known as:  FOLVITE Take 1 mg by mouth daily.   lisinopril 5 MG tablet Commonly known as:  PRINIVIL,ZESTRIL Take 5 mg by mouth daily.   magnesium oxide 400 (241.3 Mg)  MG tablet Commonly known as:  MAG-OX Take 400 mg by mouth daily.   multivitamin with minerals Tabs tablet Take 1 tablet by mouth daily.   omeprazole 20 MG capsule Commonly known as:  PRILOSEC Take 20 mg by mouth daily.   ondansetron 4 MG disintegrating tablet Commonly known as:  ZOFRAN ODT 4mg  ODT q4 hours prn nausea/vomit   oxyCODONE 5 MG/5ML solution Commonly known as:  ROXICODONE Give 2.16ml every 6 hours as needed for severe pain   potassium chloride SA 20 MEQ tablet Commonly known as:  K-DUR,KLOR-CON Take 20 mEq by mouth daily.   predniSONE 5 MG tablet Commonly known as:  DELTASONE Take 10 mg by mouth daily.   RISA-BID PROBIOTIC PO Give one tablet by mouth daily   thyroid 90 MG tablet Commonly known as:  ARMOUR Take 90 mg by mouth daily.   tolnaftate 1 % powder Commonly known as:  TINACTIN Apply daily between and under toes bilateral feet x 6 weeks for tinea pedis   traMADol 50 MG tablet Commonly known as:  ULTRAM Take one tablet by mouth every 6 hours as needed for moderate pain   VENELEX Oint Apply to sacrum, bilateral buttocks and coccyx q shift & prn       Review of Systems  In general no complaints of fever or chills.  Skin does have very fragile skin with some chronic bruising. Has a back wound would still persists but apparentlyis stable per wound care and nursing  wthoutl sign of infection  Head ears eyes nose mouth and throat does not complain of visual changes or sore throat.  Respiratory is not complaining of shortness breath or cough currently.  Cardiac does not complain of chest pain or lower extremity edema does have history coronary artery disease.  GI is not complaining of any nausea vomiting diarrhea constipation or acute abdominal pain.  GU does not complain of dysuria has been treated past for UTI.  Muscle-l skeletal does have a history of significant rheumatoid arthritis at this point pain appears to be relatively controlled although at times will have breakthrough pain. Has had at times significant neck pain and joint pain  Neurologic does not complain of dizziness headache does have significant weakness      Physical Exam   Temperature 97.1 pulse 100 respirations 20 blood pressure taken manually 142/68   : Weight is 103.4   In general this is a very frail pale female who does not appear to be in any distress lying comfortably in bed.  Her skin is warm and dry she does have a PICC line right upper arm also has some Steri-Strips applied to her right arm where apparently there've been skin tears there still is some bruising visible I do not really see sign of infection drainage bleeding at this time.  Eyes pupils appear reactive to light sclera and conjunctivae are relatively clear she does have some darkened areas on her conjunctiva the left lateral eye.  Oropharynx clear mucous membranes moist.  Chest shallow air entry but could not really appreciate any labored breathing or congestion.  Heart is regular rate and rhythm without murmur gallop or rub.  Abdomen soft does not appear to be tender there are positive bowel sounds.  Muscle skeletal has arthritic changes with some contractures of her upper extremities hands-there is still some tenderness to palpation at times of her hands-she has significant  weakness of her lower extremities bilaterally has protective boots on.  Neurologic is grossly intact her  speech is clear no lateralizing findings.   in regards to her wounds again numerous wounds appear to have healed  Per wound care her remaining wounds are stable with no signs of infection and no significant changes again she will be followed by hospice she is discharged here  Psych she is alert and oriented pleasant and appropriate  Recent Labs  10/09/16 0830  11/14/16 0700  11/21/16 0600  11/28/16 0700 12/05/16 1126 12/18/16 0400  NA 139  < > 134*  < > 134*  < > 135 132* 135  K 4.0  < > 3.4*  < > 3.5  < > 3.6 3.8 3.6  CL 99*  < > 99*  < > 96*  < > 99* 96* 96*  CO2 28  < > 27  < > 29  < > 29 28 28   GLUCOSE 58*  < > 142*  < > 60*  < > 85 103* 62*  BUN 11  < > 19  < > 22*  < > 24* 24* 15  CREATININE 0.43*  < > 0.47  < > 0.41*  < > 0.32* 0.44 0.36*  CALCIUM 9.1  < > 8.8*  < > 9.3  < > 9.2 9.1 9.2  MG 1.9  --  1.4*  --  1.7  --   --   --   --   < > = values in this interval not displayed. Liver Function Tests:  Recent Labs  11/09/16 1006 11/09/16 1405 11/26/16 0700  AST 86* 22 35  ALT 27 20 20   ALKPHOS 84 54 66  BILITOT 2.4* 0.5 0.4  PROT 6.2* 4.5* 5.5*  ALBUMIN 3.0* 2.1* 2.5*   No results for input(s): LIPASE, AMYLASE in the last 8760 hours. No results for input(s): AMMONIA in the last 8760 hours. CBC:  Recent Labs  11/09/16 1006 11/26/16 0700 12/18/16 0400  WBC 27.4* 17.3* 18.0*  NEUTROABS 24.0* 14.1* 14.2*  HGB 16.3* 13.5 14.3  HCT 47.2* 40.1 43.7  MCV 104.7* 102.0* 100.5*  PLT 415* 434* 452*   Cardiac Enzymes:  Recent Labs  10/02/16 1104  TROPONINI <0.03   BNP: Invalid input(s): POCBNP CBG:  Recent Labs  10/02/16 1111  GLUCAP 106*    Procedures and Imaging Studies During Stay: No results found.  Assessment/Plan:    #1 history of wounds-these apparently have improved- She will be discharged under hospice services- At this point  will be monitored by wound care and Dr. Lyndel Safe.--Per wound there is no sign of infection  #2 history of rheumatoid arthritis this appears under somewhat better control on routine prednisone as well as when necessary tramadol and oxycodone.  #3 hypertension this appears stable on lisinopril 5 mg a day. Has somewhat variable blood pressures but I do not see consistent low or high readings  #4 anemia thought to have an element of iron deficiency she is on iron hemoglobin is 14.3  on lab done in late April  #5 leukocytosis white count has come down and now appears stabilized at around 18,000 no signs of infection as noted above.  #6 history of hypothyroidism TSH in February was 4.528 she is on Armour Thyroid.  #7-history of hypokalemia she is on potassium and magnesium supplementation this appears stabilized with potassium of 3.6 on lab done in late April  Number 8- history of osteoporosis she is on calcium and vitamin D.  #Coronary artery disease he is on aspirin twice a day appears relatively asymptomatic does not really  complain of chest pain.  #10 history of weight loss-her weight appears to be slowly trending up which is encouraging she is on supplements.  Again she will be going to another facility in [place] and will be under hospice services there she does have a hospital bed in her mattress provided by hospice.  Her pain appears to be controlled on above medications including oxycodone when necessary.  She has support from her husband who is here consistently as well as other family members.  [id number] note greater than 30 minutes spent on this discharge summary-greater than 50% of time spent coordinating plan of care for numerous diagnoses  :

## 2017-02-22 DEATH — deceased

## 2017-06-02 IMAGING — CT CT RENAL STONE PROTOCOL
2 of 4 series · 14 of 46 positions shown, 16 images · non-contrast
Comparison: No priors.

CLINICAL DATA: 75-year-old female with history of black watery
stools positive for occult blood. Vomiting.

EXAM:
CT ABDOMEN AND PELVIS WITHOUT CONTRAST
TECHNIQUE: Multidetector CT imaging of the abdomen and pelvis was performed
following the standard protocol without IV contrast.

[Series 3: axial st · axial · 0.71mm/px · z∈[+946,+1356]mm · 11 of 92 slices shown, 13 images]
[im 5/92  soft-tissue]
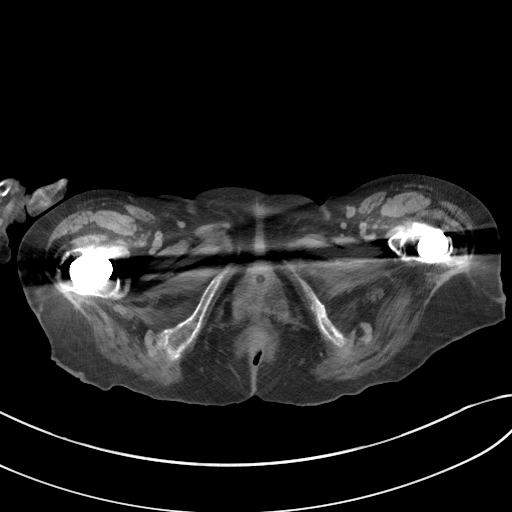
[im 5/92  bone]
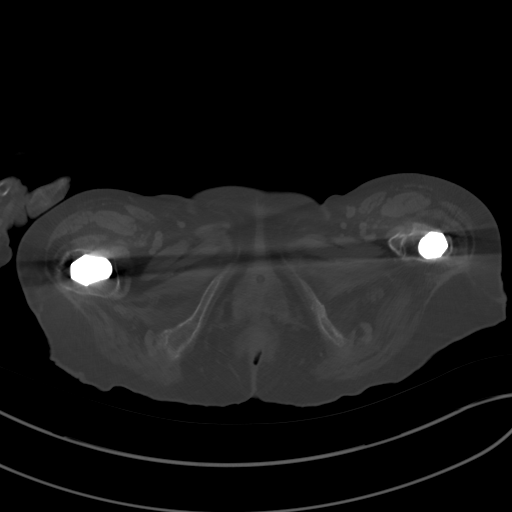
[im 13/92  soft-tissue]
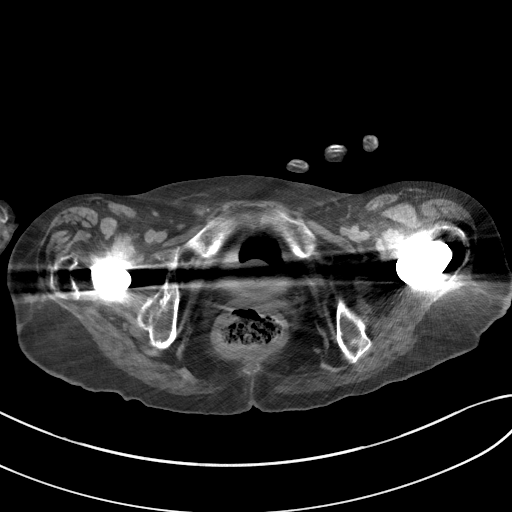
[im 21/92  soft-tissue]
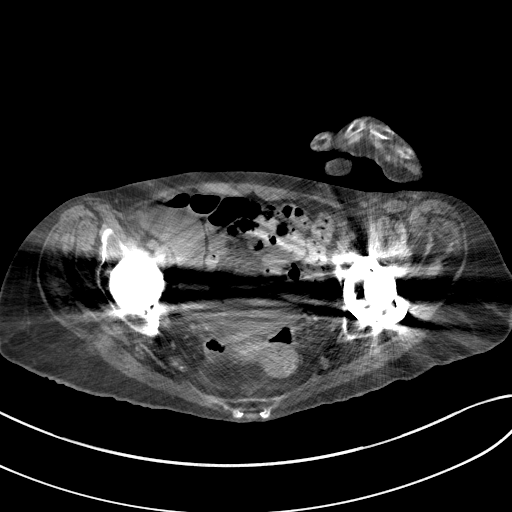
[im 29/92  soft-tissue]
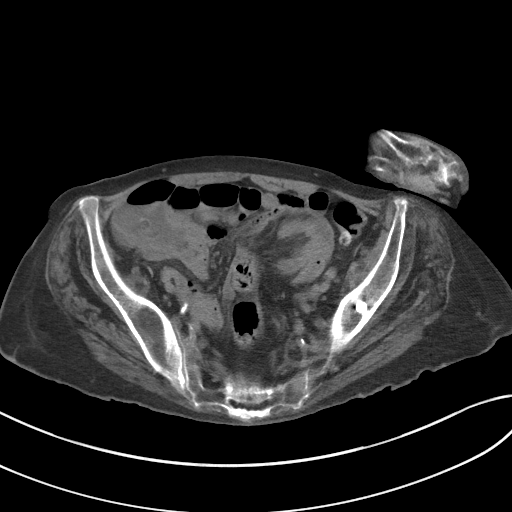
[im 38/92  soft-tissue]
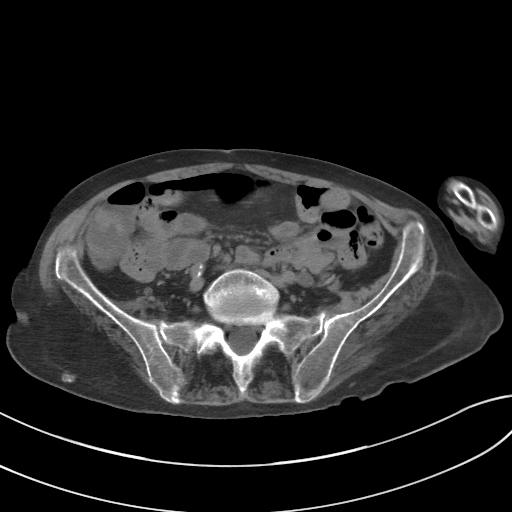
[im 46/92  soft-tissue]
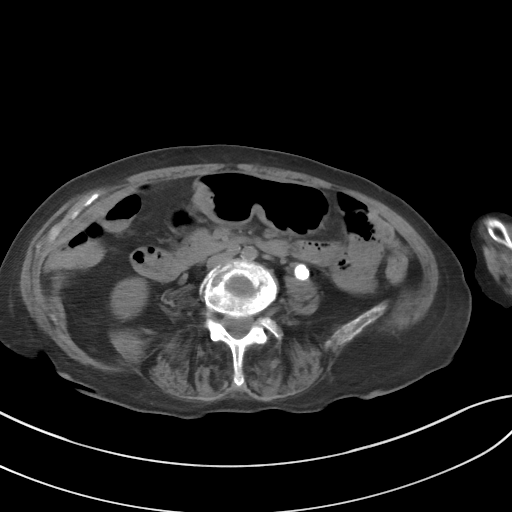
[im 54/92  soft-tissue]
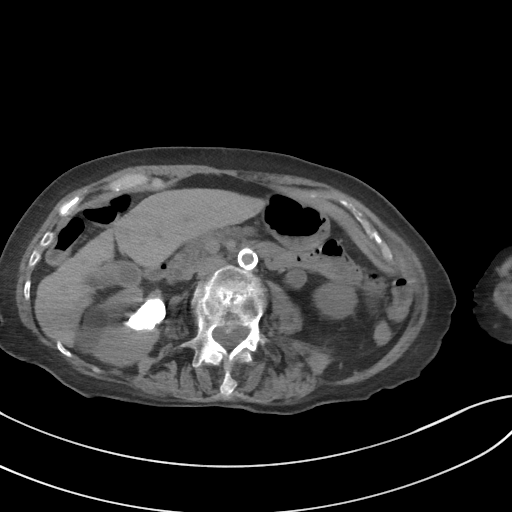
[im 63/92  soft-tissue]
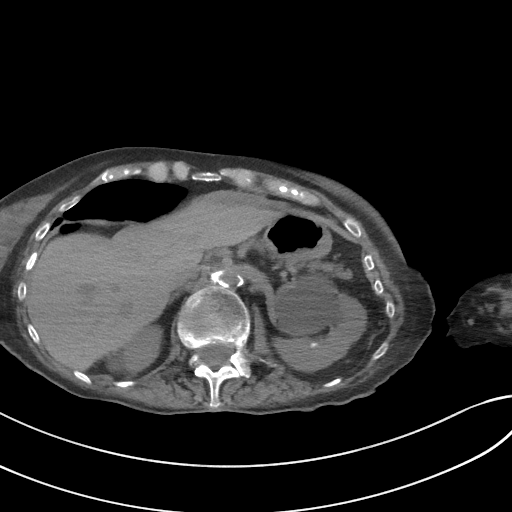
[im 71/92  soft-tissue]
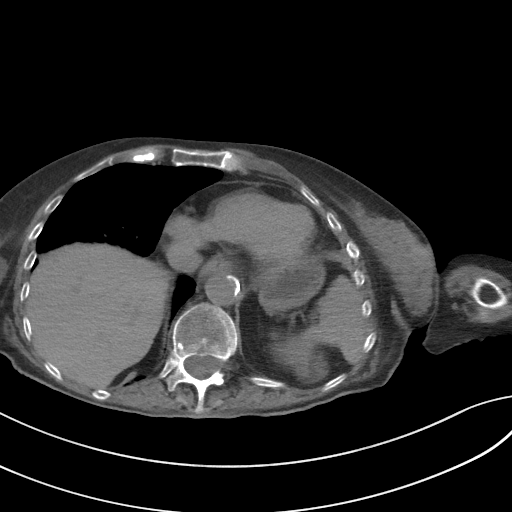
[im 71/92  bone]
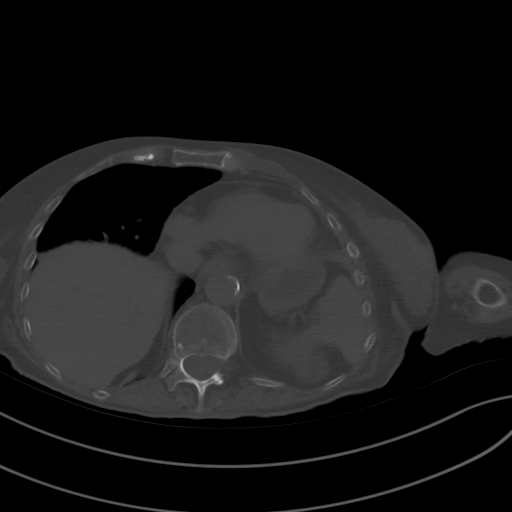
[im 79/92  soft-tissue]
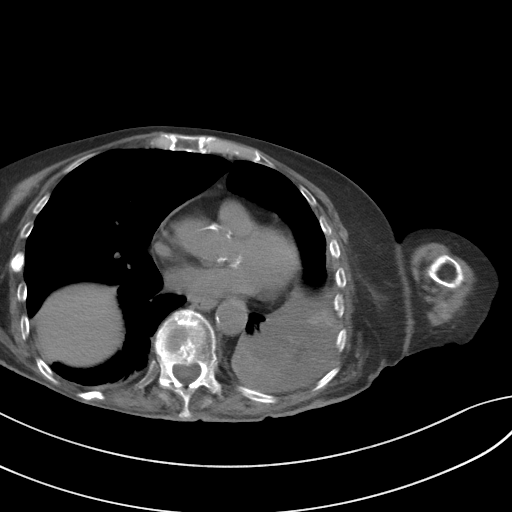
[im 87/92  soft-tissue]
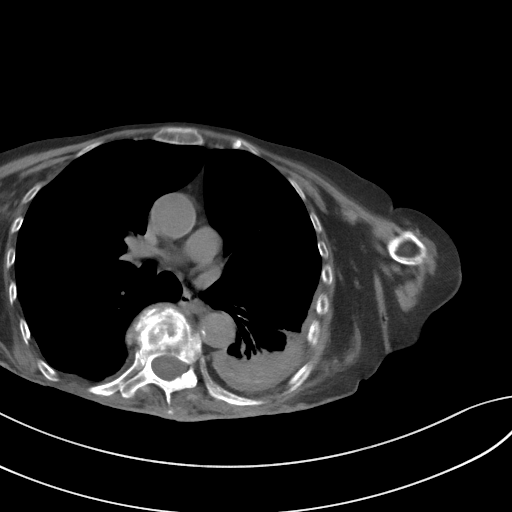

[Series 6: coronal st · coronal · 0.76mm/px · 3 of 95 slices shown]
[im 32/95  soft-tissue]
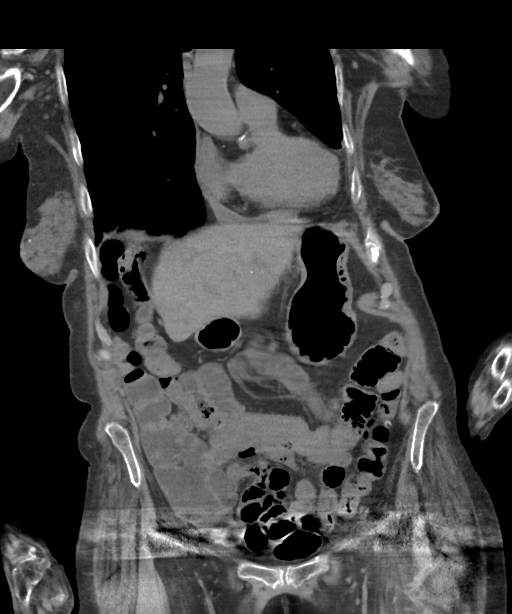
[im 42/95  soft-tissue]
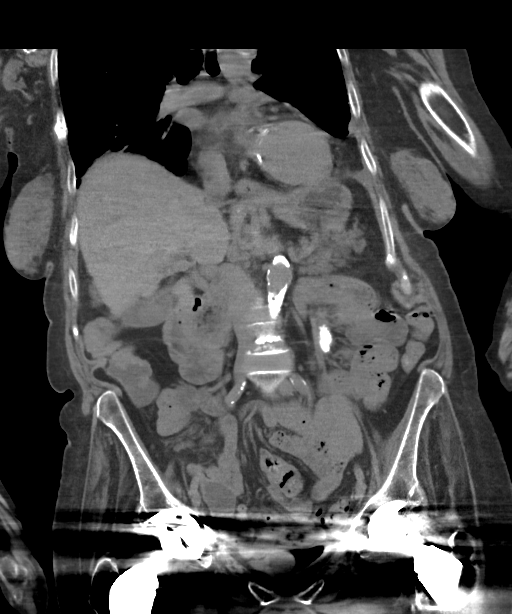
[im 53/95  soft-tissue]
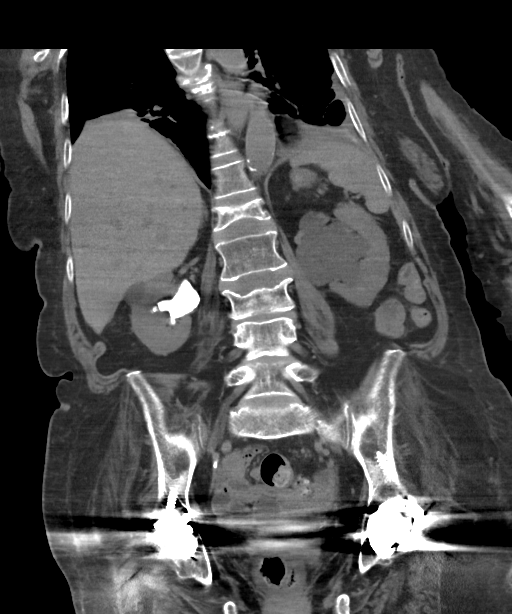

[14 of 46 positions shown; findings below may reference images not displayed]

FINDINGS: Lower chest: Volume loss and airspace consolidation in the left
lower lobe. Small left pleural effusion. Subsegmental atelectasis in
the right lower lobe. Atherosclerotic calcifications in the left
anterior descending and right coronary arteries. Calcifications of
the aortic valve.

Hepatobiliary: No discrete cystic or solid hepatic lesions are
identified on today's noncontrast CT examination. Partially
calcified gallstone measuring up to 2 cm in diameter in the fundus
of the gallbladder. No evidence of acute cholecystitis at this time.

Pancreas: No definite pancreatic mass or peripancreatic inflammatory
changes are noted on today's noncontrast CT examination.

Spleen: Unremarkable.

Adrenals/Urinary Tract: Multiple large calculi in the collecting
systems of the kidneys bilaterally, largest of which fills much of
the right renal pelvis and collecting system where there is a
staghorn calculus which measures approximately 1.7 x 3.9 x 3.2 cm
(axial image 41 of series 3 and coronal image 61 of series 6).
Multiple calculi are also present in the proximal third of the left
ureter in total spanning a length of 3.9 cm (coronal image 51 of
series 6). This is associated with moderate proximal left
hydroureteronephrosis. No definite calculi within the lumen of the
urinary bladder, although much of the urinary bladder is obscured by
extensive beam hardening artifact from the patient's bilateral hip
arthroplasties. Multiple low-attenuation lesions in the right
kidney, incompletely characterized on today's noncontrast CT
examination, but likely to represent cysts, measuring up to 2.4 cm.
Foley balloon catheter present within the lumen of the urinary
bladder. Bilateral adrenal glands are normal in appearance.

Stomach/Bowel: Unenhanced appearance of the stomach is normal. No
pathologic dilatation of small bowel or colon. Numerous colonic
diverticulae are noted, without surrounding inflammatory changes to
suggest an acute diverticulitis at this time. Normal appendix.

Vascular/Lymphatic: Aortic atherosclerosis, without definite
aneurysm in the abdominal or pelvic vasculature. No lymphadenopathy
noted in the abdomen or pelvis on today's noncontrast CT
examination.

Reproductive: Uterus is not confidently identified, likely
surgically absent. Low anatomic pelvis is largely obscured by beam
hardening artifact from bilateral hip arthroplasties. Ovaries are
atrophic.

Other: No significant volume of ascites.  No pneumoperitoneum.

Musculoskeletal: Multiple chronic appearing compression fractures
are noted in the thorax the lumbar spine, involving T8, T9, T10,
T12, L1, L3 and L4. These are most severe at T8 where there is
complete collapse and vertebra plana appearance. Status post
bilateral total hip arthroplasty. Old healed left inferior pubic
ramus fracture.
IMPRESSION: 1. Colonic diverticulosis. No findings to suggest an acute
diverticulitis at this time.
2. Numerous large calculi in the collecting systems of the kidneys
bilaterally, including a staghorn calculus in the right renal pelvis
and collecting system which measures 1.7 x 3.9 x 3.2 cm. In
addition, there is a cluster of multiple large calculi in the
proximal third of the left ureter which span a total length of
cm and are associated with moderate left hydroureteronephrosis
indicative of obstruction.
3. Combination of atelectasis and airspace consolidation in the left
lower lobe concerning for pneumonia. Small left parapneumonic
pleural effusion.
4. Aortic atherosclerosis, in addition to 2 vessel coronary artery
disease. Please note that although the presence of coronary artery
calcium documents the presence of coronary artery disease, the
severity of this disease and any potential stenosis cannot be
assessed on this non-gated CT examination. Assessment for potential
risk factor modification, dietary therapy or pharmacologic therapy
may be warranted, if clinically indicated.
5. There are calcifications of the aortic valve. Echocardiographic
correlation for evaluation of potential valvular dysfunction may be
warranted if clinically indicated.
6. Multiple old thoracolumbar vertebral body compression fractures,
as above.
7. Additional incidental findings, as above.
